# Patient Record
Sex: Female | Born: 1945 | Race: Black or African American | Hispanic: No | Marital: Married | Smoking: Never smoker
Health system: Southern US, Community
[De-identification: ages and names within clinical notes are randomized; demographics above are authoritative.]

## PROBLEM LIST (undated history)

## (undated) DIAGNOSIS — D649 Anemia, unspecified: Secondary | ICD-10-CM

## (undated) DIAGNOSIS — I10 Essential (primary) hypertension: Secondary | ICD-10-CM

## (undated) DIAGNOSIS — E785 Hyperlipidemia, unspecified: Secondary | ICD-10-CM

## (undated) HISTORY — PX: HERNIA REPAIR: SHX51

## (undated) HISTORY — PX: TUBAL LIGATION: SHX77

---

## 2002-09-24 ENCOUNTER — Other Ambulatory Visit: Admission: RE | Admit: 2002-09-24 | Discharge: 2002-09-24 | Payer: Self-pay | Admitting: Internal Medicine

## 2003-11-25 ENCOUNTER — Encounter: Payer: Self-pay | Admitting: Internal Medicine

## 2003-12-29 ENCOUNTER — Other Ambulatory Visit: Admission: RE | Admit: 2003-12-29 | Discharge: 2003-12-29 | Payer: Self-pay | Admitting: Obstetrics and Gynecology

## 2005-01-07 ENCOUNTER — Ambulatory Visit: Payer: Self-pay | Admitting: Internal Medicine

## 2005-04-26 ENCOUNTER — Ambulatory Visit: Payer: Self-pay | Admitting: Family Medicine

## 2005-08-04 ENCOUNTER — Ambulatory Visit: Payer: Self-pay | Admitting: Internal Medicine

## 2006-03-03 ENCOUNTER — Ambulatory Visit: Payer: Self-pay | Admitting: Internal Medicine

## 2006-03-29 ENCOUNTER — Ambulatory Visit: Payer: Self-pay | Admitting: Internal Medicine

## 2006-09-15 ENCOUNTER — Ambulatory Visit: Payer: Self-pay | Admitting: Internal Medicine

## 2007-01-16 ENCOUNTER — Ambulatory Visit: Payer: Self-pay | Admitting: Internal Medicine

## 2007-08-21 ENCOUNTER — Ambulatory Visit: Payer: Self-pay | Admitting: Internal Medicine

## 2007-08-21 DIAGNOSIS — F4320 Adjustment disorder, unspecified: Secondary | ICD-10-CM | POA: Insufficient documentation

## 2007-08-21 DIAGNOSIS — J309 Allergic rhinitis, unspecified: Secondary | ICD-10-CM | POA: Insufficient documentation

## 2007-08-21 DIAGNOSIS — J45909 Unspecified asthma, uncomplicated: Secondary | ICD-10-CM | POA: Insufficient documentation

## 2007-08-21 DIAGNOSIS — F519 Sleep disorder not due to a substance or known physiological condition, unspecified: Secondary | ICD-10-CM | POA: Insufficient documentation

## 2008-01-15 ENCOUNTER — Telehealth: Payer: Self-pay | Admitting: Internal Medicine

## 2008-08-12 ENCOUNTER — Ambulatory Visit: Payer: Self-pay | Admitting: Internal Medicine

## 2008-08-12 DIAGNOSIS — H5789 Other specified disorders of eye and adnexa: Secondary | ICD-10-CM | POA: Insufficient documentation

## 2008-08-12 DIAGNOSIS — J45901 Unspecified asthma with (acute) exacerbation: Secondary | ICD-10-CM | POA: Insufficient documentation

## 2008-08-12 LAB — CONVERTED CEMR LAB: Blood Glucose, Fingerstick: 100

## 2008-08-13 ENCOUNTER — Ambulatory Visit: Payer: Self-pay | Admitting: Internal Medicine

## 2008-08-13 LAB — CONVERTED CEMR LAB
ALT: 16 units/L (ref 0–35)
AST: 18 units/L (ref 0–37)
Albumin: 4.1 g/dL (ref 3.5–5.2)
Alkaline Phosphatase: 85 units/L (ref 39–117)
BUN: 14 mg/dL (ref 6–23)
Bilirubin, Direct: 0.1 mg/dL (ref 0.0–0.3)
CO2: 27 meq/L (ref 19–32)
Calcium: 9.3 mg/dL (ref 8.4–10.5)
Chloride: 108 meq/L (ref 96–112)
Cholesterol: 247 mg/dL (ref 0–200)
Creatinine, Ser: 0.6 mg/dL (ref 0.4–1.2)
Direct LDL: 143.8 mg/dL
Free T4: 0.6 ng/dL (ref 0.6–1.6)
GFR calc Af Amer: 131 mL/min
GFR calc non Af Amer: 108 mL/min
Glucose, Bld: 112 mg/dL — ABNORMAL HIGH (ref 70–99)
HDL: 68.9 mg/dL (ref >39.0)
Potassium: 4.5 meq/L (ref 3.5–5.1)
Sodium: 143 meq/L (ref 135–145)
TSH: 0.32 microintl units/mL — ABNORMAL LOW (ref 0.35–5.50)
Total Bilirubin: 0.6 mg/dL (ref 0.3–1.2)
Total CHOL/HDL Ratio: 3.6
Total Protein: 7 g/dL (ref 6.0–8.3)
Triglycerides: 55 mg/dL (ref 0–149)
VLDL: 11 mg/dL (ref 0–40)

## 2008-08-25 ENCOUNTER — Ambulatory Visit: Payer: Self-pay | Admitting: Internal Medicine

## 2008-08-25 DIAGNOSIS — R7309 Other abnormal glucose: Secondary | ICD-10-CM | POA: Insufficient documentation

## 2008-08-25 DIAGNOSIS — E079 Disorder of thyroid, unspecified: Secondary | ICD-10-CM | POA: Insufficient documentation

## 2009-03-30 ENCOUNTER — Ambulatory Visit: Payer: Self-pay | Admitting: Family Medicine

## 2009-06-12 ENCOUNTER — Ambulatory Visit: Payer: Self-pay | Admitting: Internal Medicine

## 2009-10-29 ENCOUNTER — Ambulatory Visit: Payer: Self-pay | Admitting: Internal Medicine

## 2009-10-29 LAB — CONVERTED CEMR LAB
Thyroglobulin Ab: <30 (ref 0.0–60.0)
Thyroperoxidase Ab SerPl-aCnc: 36.2 (ref 0.0–60.0)

## 2009-11-04 ENCOUNTER — Telehealth: Payer: Self-pay | Admitting: *Deleted

## 2009-11-04 LAB — CONVERTED CEMR LAB
ALT: 16 units/L (ref 0–35)
AST: 19 units/L (ref 0–37)
Albumin: 4 g/dL (ref 3.5–5.2)
Alkaline Phosphatase: 87 units/L (ref 39–117)
BUN: 10 mg/dL (ref 6–23)
Bilirubin, Direct: 0 mg/dL (ref 0.0–0.3)
CO2: 26 meq/L (ref 19–32)
Calcium: 8.9 mg/dL (ref 8.4–10.5)
Chloride: 101 meq/L (ref 96–112)
Creatinine, Ser: 0.9 mg/dL (ref 0.4–1.2)
Free T4: 0.9 ng/dL (ref 0.6–1.6)
GFR calc non Af Amer: 67.34 mL/min (ref >60)
Glucose, Bld: 98 mg/dL (ref 70–99)
Hgb A1c MFr Bld: 6.3 % (ref 4.6–6.5)
Potassium: 3.8 meq/L (ref 3.5–5.1)
Sodium: 137 meq/L (ref 135–145)
T3, Free: 3 pg/mL (ref 2.3–4.2)
TSH: 0.82 microintl units/mL (ref 0.35–5.50)
Total Bilirubin: 0.6 mg/dL (ref 0.3–1.2)
Total Protein: 6.8 g/dL (ref 6.0–8.3)

## 2009-11-13 ENCOUNTER — Encounter: Payer: Self-pay | Admitting: Internal Medicine

## 2010-03-26 ENCOUNTER — Telehealth: Payer: Self-pay | Admitting: *Deleted

## 2010-11-16 NOTE — Assessment & Plan Note (Signed)
Summary: ear ache/mhf   Vital Signs:  Patient profile:   65 year old female Weight:      220 pounds Temp:     98.5 degrees F oral BP sitting:   140 / 80  (left arm) Cuff size:   regular  Vitals Entered By: Sid Falcon LPN (March 30, 2009 10:59 AM) CC: Right ear pain X 1 week   History of Present Illness: Patient seen as a work in with right earache of one week duration.  she used a perm on her hair approximately one week ago and was uncertain whether or some of that may have gotten into her ear aggravated things. She has not had any drainage. No fever. No recent swimming.  no recent hearing changes. Denies nasal congestive symptoms.  Allergies: 1)  ! Sulfa 2)  ! Penicillin  Past History:  Past Medical History: Last updated: 08/25/2008 Allergic rhinitis Asthma     Review of Systems       see history of present illness. Also patient denies any vertigo or dizziness.  Physical Exam  Ears:  patient has mild erythema the left canal the eardrum appears normal. Right eardrum is not visualized secondary to some cerumen deep in the canal. She has positive erythema involving the canal and mild swelling of the external canal. Mouth:  Oral mucosa and oropharynx without lesions or exudates.  Teeth in good repair. Neck:  No deformities, masses, or tenderness noted. Lungs:  Normal respiratory effort, chest expands symmetrically. Lungs are clear to auscultation, no crackles or wheezes.   Impression & Recommendations:  Problem # 1:  OTITIS EXTERNA (ICD-380.10) Assessment New  we'll prescribe medicated antibiotic drops. Keep ear dry as possible. We offered manual removal of debris but she was reluctant because of sensitivity. We explained this may ultimately have to be removed for full healing.  Her updated medication list for this problem includes:    Cortisporin 3.5-10000-1 Soln (Neomycin-polymyxin-hc) .Marland Kitchen... 3-4 drops each ear qid for 5-7 days  Complete Medication List: 1)   Hydrochlorothiazide 25 Mg Tabs (Hydrochlorothiazide) .Marland Kitchen.. 1 by mouth once daily as needed 2)  Advair Diskus 100-50 Mcg/dose Misc (Fluticasone-salmeterol) .... As needed 3)  Cortisporin 3.5-10000-1 Soln (Neomycin-polymyxin-hc) .... 3-4 drops each ear qid for 5-7 days  Patient Instructions: 1)  Keep ears dry as possible. Be in touch in a week or so if symptoms not improved. Prescriptions: CORTISPORIN 3.5-10000-1 SOLN (NEOMYCIN-POLYMYXIN-HC) 3-4 drops each ear qid for 5-7 days  #10 ml x 1   Entered and Authorized by:   Evelena Peat MD   Signed by:   Evelena Peat MD on 03/30/2009   Method used:   [facility] to        [phone] (retail)       [address] [place], [place]  [address]       Ph: [phone] or [phone]       Fax: [phone]   RxID:   [id number]

## 2010-11-16 NOTE — Progress Notes (Signed)
Summary: new phone # please call  Phone Note Call from Patient Call back at [phone]   Caller: Patient Call For: Madelin Headings MD Summary of Call: Marlissa Emerick please call pt at new phone # [phone] Initial call taken by: Heron Sabins,  November 04, 2009 12:38 PM  Follow-up for Phone Call        Pt aware of results. Follow-up by: [name], CMA (AAMA),  November 04, 2009 2:53 PM

## 2010-11-16 NOTE — Consult Note (Signed)
Summary: [facility]  [facility]   Imported By: Maryln Gottron 12/04/2009 15:36:05  _____________________________________________________________________  External Attachment:    Type:   Image     Comment:   External Document

## 2010-11-16 NOTE — Assessment & Plan Note (Signed)
Summary: not sleeping/mhf  Medications Added HYDROCHLOROTHIAZIDE 25 MG TABS (HYDROCHLOROTHIAZIDE) 1 by mouth once daily as needed * LUNESTA 2 MG  TABS (ESZOPICLONE) AND 3 MG 1 by mouth hs as directed samples      Allergies Added: ! SULFA ! PENICILLIN  Vital Signs:  Patient Profile:   65 Years Old Female Weight:      203 pounds Pulse rate:   88 / minute BP sitting:   140 / 80  (left arm) Cuff size:   regular  Vitals Entered By: [name], CMA (August 21, 2007 2:15 PM)                 Chief Complaint:  Pt having panic attacks, hasn't sleep x 2 days, takes benadryl and only sleeps 2 hours, and seen a homepathic md and they gave her trazadone 50mg  and natural progesterone but pt hasn't taken them yet.  History of Present Illness: Jill Burton comes in for above reason for an acute visit.   homeopathic doc rec trazadone 50 x 1 and didn't like tte way she feels.    Husband has cancer  colon and in an out of hospital on chemo.  Last good sleep or baseline about 2 months ago.   No caffiene or etoh. thinkds the sleep issue is stress. No previous hx of panic disorder of sig anxiety. ASthma wheezing thinks ok.  5-6 months ago was on thyroid meds per Dr. Alessandra Burton but  not on this now. She has lost weight for ? reason  Doesn't usually like to take meds had normal sugar per Dr Jill Burton who wanted to do more blood tests.   Current Allergies (reviewed today): ! SULFA ! PENICILLIN  Past Medical History:    Reviewed history and no changes required:       Allergic rhinitis       Asthma  Past Surgical History:    Caesarean section   Family History:    [name]  Social History:    Married    Never Smoked   Risk Factors:  Tobacco use:  never   Review of Systems       see hpi   Physical Exam  General:     alert, well-developed, well-nourished, and well-hydrated.   Head:     normocephalic.   Eyes:     vision grossly intact, pupils equal, and pupils round.     Neck:     No deformities, masses, or tenderness noted. Lungs:     Normal respiratory effort, chest expands symmetrically. Lungs are clear to auscultation, no crackles or wheezes. Heart:     Normal rate and regular rhythm. S1 and S2 normal without gallop, murmur, click, rub or other extra sounds. Abdomen:     Bowel sounds positive,abdomen soft and non-tender without masses, organomegaly or hernias noted. Neurologic:     grossly normal    no tremor Skin:     turgor normal and color normal.   Cervical Nodes:     No lymphadenopathy noted    Impression & Recommendations:  Problem # 1:  INSOMNIA-SLEEP DISORDER-UNSPEC (ICD-307.40) seems to be reactive worsening of a baseline problem   disc options but would try lunesta smples 2 mg and then 3 mg and call about progress.  ? about hx of thyroid rx  have no records regarding this to give advice.  Would rec tsh ft3 and free t4  with thyroid antibody studies in the future when can. 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 1 by mouth once daily as needed 2)  Lunesta 2 Mg Tabs (eszopiclone) and 3 Mg  .Marland KitchenMarland Kitchen. 1 by mouth hs as directed samples   Patient Instructions: 1)  take one 2 mg tab at night for 2-3 nights  2)  If helpful then take as needed 3)  If not helpful then try one 3 mg tab at night for 2-3 nights.    ]

## 2010-11-16 NOTE — Assessment & Plan Note (Signed)
Summary: FU ON LABS/NJR   Vital Signs:  Patient Profile:   65 Years Old Female Weight:      229 pounds Pulse rate:   78 / minute BP sitting:   120 / 80  (left arm) Cuff size:   large  Vitals Entered By: [name], CMA (August 25, 2008 2:33 PM)                 Chief Complaint:  Follow up.  History of Present Illness: Jill Burton is here for a follow up on labs.  and Asthma. Current Problems:  REDNESS OR DISCHARGE OF EYE (ICD-379.93) better after patano; drops  ASTHMA, WITH ACUTE EXACERBATION (ICD-493.92) better after prednisone . Usually takes Advair INSOMNIA-SLEEP DISORDER-UNSPEC (ICD-307.40) ALLERGIC RHINITIS (ICD-477.9) Asks about weight and how to lose weight.      Prior Medications Reviewed Using: Patient Recall  Updated Prior Medication List: HYDROCHLOROTHIAZIDE 25 MG TABS (HYDROCHLOROTHIAZIDE) 1 by mouth once daily as needed PATADAY 0.2 % SOLN (OLOPATADINE HCL) 1 qtt  each eye once daily  Current Allergies (reviewed today): ! SULFA ! 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         always very busy and active   hard to relax    Physical Exam  General:     Well-developed,well-nourished,in no acute distress; alert,appropriate and cooperative throughout examination Head:     normocephalic and atraumatic.   Eyes:     no more redness or discharge vision grossly intact, pupils equal, and pupils round.   Ears:     R ear normal and L ear normal.   Neck:     No deformities, masses, or tenderness noted. Lungs:     Normal respiratory effort, chest expands symmetrically. Lungs are clear to auscultation, no crackles or wheezes. Heart:     Normal rate and regular rhythm. S1 and S2 normal without  gallop, murmur, click, rub or other extra sounds. Abdomen:     Bowel sounds positive,abdomen soft and non-tender without masses, organomegaly or hernias noted. Pulses:     intact  Extremities:     no cce  Neurologic:     non focal Cervical Nodes:     No lymphadenopathy noted Psych:     Oriented X3, normally interactive, and good eye contact.   Additional Exam:     see labs    Impression & Recommendations:  Problem # 1:  ASTHMA, WITH ACUTE EXACERBATION (ICD-493.92) Assessment: Improved  The following medications were removed from the medication list:    Prednisone 20 Mg Tabs (Prednisone) .Marland Kitchen... 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 1 by mouth  once daily as needed 2)  Pataday 0.2 % Soln (Olopatadine hcl) .Marland Kitchen.. 1 qtt  each eye once daily   Patient Instructions: 1)  take  advair 1 puff two times a day for a month and if breathing ok then can decrease to 1 per day.  2)  Avoid simple sugars and sweets . white bread   and white rice  to help blood sugar.     3)  3200 calories is a pound. 4)  No calories in their beverages  . dont skip meals  avoid eating out.   Last meal of day should be the lightest. 5)  Eye drops if needed 6)  Recommend repeating   thyroid tests and if still abnormal rec   endocrinology referral to evaluate.   7)  tsh free t3 free t4   hg a1c   790.21   ]

## 2010-11-16 NOTE — Assessment & Plan Note (Signed)
Summary: eyes hurt/mhf   Vital Signs:  Patient Profile:   65 Years Old Female Weight:      228 pounds O2 Sat:      99 % O2 treatment:    Room Air Temp:     97.9 degrees F oral Pulse rate:   60 / minute BP sitting:   120 / 80  (left arm) Cuff size:   large  Vitals Entered By: [name], CMA (August 12, 2008 11:04 AM)             CBG Result 100     Chief Complaint:  Multiple medical problems or concerns.  History of Present Illness: Jill Burton is here for acute evaluation because her eyes have  been blood shot off and on x 1 week.She has used some otc Get the red out drops and contiuing .sx   No change in vision and no itching or discharge otherwise.    Pt is also having some vaginal itching but no discharge and wants to discuss doing tsh because she is not loosing wt. Pt thinks that the vaginal itching is due to diabetes.  Has been wheezing about a week    restarted and ? needs rx for   After asthma medicaition  ?  sees dr Alessandra Bevels  for homeopathic injections mg 2 x per day in the past.     Her last appt with [place] was almost a year ago 11 08.   Apparently care via Dr Alessandra Bevels and not here?       Prior Medications Reviewed Using: Patient Recall  Prior Medication List:  HYDROCHLOROTHIAZIDE 25 MG TABS (HYDROCHLOROTHIAZIDE) 1 by mouth once daily as needed * LUNESTA 2 MG  TABS (ESZOPICLONE) AND 3 MG 1 by mouth hs as directed samples FLEXERIL 10 MG  TABS (CYCLOBENZAPRINE HCL) one by mouth tid   Current Allergies (reviewed today): ! SULFA ! PENICILLIN  Past Medical History:    Allergic rhinitis    Asthma       Past Surgical History:    Caesarean section    Social History:    Married      Never Smoked    Review of Systems  The patient denies anorexia, fever, weight loss, chest pain, syncope, hemoptysis, abdominal pain, melena, hematochezia, severe indigestion/heartburn, hematuria, transient blindness, difficulty walking, unusual weight change, enlarged  lymph nodes, and angioedema.         no photophobia nvd    Physical Exam  General:     alert, well-developed, well-nourished, and well-hydrated.  wheezing in nad  mildly hoarse Head:     normocephalic and atraumatic.   Eyes:     vision grossly intact, pupils equal, and pupils round.  mild redness without flush or tearing or dc Ears:     R ear normal, L ear normal, and no external deformities.   Nose:     no external deformity and no external erythema.  stuffy Mouth:     pharynx pink and moist and no erythema.   Neck:     No deformities, masses, or tenderness noted.no thyromegaly.   Lungs:     no intercostal retractions and no accessory muscle use.  inc resp rate  diffuse wheezing  bilateral without rales or rhonchi  soomewhat tight  Heart:     normal rate, regular rhythm, no murmur, no gallop, and no lifts.   Abdomen:     Bowel sounds positive,abdomen soft and non-tender without masses, organomegaly or hernias noted. Pulses:  no cce  Extremities:     no cce  Skin:     turgor normal, color normal, no ecchymoses, and no petechiae.   Cervical Nodes:     No lymphadenopathy noted Psych:     Oriented X3, normally interactive, and good eye contact.      Impression & Recommendations:  Problem # 1:  REDNESS OR DISCHARGE OF EYE (ICD-379.93) poss rebound from otc drop?   no vision change or obvious infection.     samples of pataday given . I am much more concerns about her asthma than her eye findings at present  Problem # 2:  ASTHMA, WITH ACUTE EXACERBATION (ZOX-096.04) Assessment: New Pt has typically not come in for  follow up or chornic care of asthma and  is apparently under care for various things for homeopathic rx of asthm with  dr Alessandra Bevels therefore management is problematic and it seems that the majority of ovs are for acute problems and has a hx of asthma flares... Her updated medication list for this problem includes:    Prednisone 20 Mg Tabs (Prednisone) .Marland Kitchen... Take  3 by mouth once daily for 2 days then 2 by mouth once daily for 3 days or as directed    TOday has   multiple concerns about dm weight and vaginal signs   will need to be addressed at a regular ov or cpx    as today is eval and rx for the acute problems   counseled about improtance of asthma control  and follow up   Complete Medication List: 1)  Hydrochlorothiazide 25 Mg Tabs (Hydrochlorothiazide) .Marland Kitchen.. 1 by mouth once daily as needed 2)  Lunesta 2 Mg Tabs (eszopiclone) and 3 Mg  .Marland KitchenMarland Kitchen. 1 by mouth hs as directed samples 3)  Flexeril 10 Mg Tabs (Cyclobenzaprine hcl) .... One by mouth tid 4)  Prednisone 20 Mg Tabs (Prednisone) .... Take 3 by mouth once daily for 2 days then 2 by mouth once daily for 3 days or as directed 5)  Pataday 0.2 % Soln (Olopatadine hcl) .Marland Kitchen.. 1 qtt  each eye once daily   Patient Instructions: 1)  take prednisone  and eye drops   2)  schedule for fasting labs  lipid bmp tsh free t4 lfts  dx weight gain fatigue  asthma the return office visit with results and reassessment.  3)  See if  you can get copy of labs of Dr Alessandra Bevels.     Prescriptions: PREDNISONE 20 MG TABS (PREDNISONE) take 3 by mouth once daily for 2 days then 2 by mouth once daily for 3 days or as directed  #20 x 0   Entered and Authorized by:   Madelin Headings MD   Signed by:   Madelin Headings MD on 08/12/2008   Method used:   [facility]ronically to        [facility]  [phone]* (retail)       [address] [place], [place]  [address]       Ph: [phone] or [phone]       Fax: [phone]   RxID:   [id number]  ]

## 2010-11-16 NOTE — Assessment & Plan Note (Signed)
Summary: follow up/pt req to get thyroid retested/cjr/pt rescd--office...   Vital Signs:  Patient profile:   65 year old female Menstrual status:  postmenopausal Weight:      230 pounds O2 Sat:      98 % on Room air Temp:     98.2 degrees F oral Pulse rate:   72 / minute BP sitting:   110 / 80  (right arm) Cuff size:   large  Vitals Entered By: [name], CMA (AAMA) (October 29, 2009 10:04 AM)  O2 Flow:  Room air CC: Pt is here for coughing and congestion. Pt states that she is also having blood sinus congestion and wheezing in chest area. Pt has been using advair and proair with sudafed x 3 weeks. Pt would also like to have her thyroid check as well.   History of Present Illness: Joeann Wehrman comein today  has had a cold for a week and / if asthma flaring took sudafed.  and albuterol and advair for weeks.  Using advair  once a day   .   No fever .    flares with cold  wheezing for 2 weeks  .   med helps  for 6 hours. sometimes gets mouth sores with advair.Husband concerned about her wheezing. Never tested  allergy but ? all to mold .     Supplements    had been on herbs in the past  . ? if any one week ago. Not  hyroid meds.last year rec to repeat thyroid and was done elsewhere but we didnt get  copies(? Dr Alessandra Bevels).  Tends to gain weight easily so wants to recheck.     Preventive Screening-Counseling & Management  Alcohol-Tobacco     Alcohol drinks/day: 0     Smoking Status: never  Caffeine-Diet-Exercise     Caffeine use/day: 1     Does Patient Exercise: no  Current Medications (verified): 1)  Hydrochlorothiazide 25 Mg Tabs (Hydrochlorothiazide) .Marland Kitchen.. 1 By Mouth Once Daily As Needed 2)  Advair Diskus 100-50 Mcg/dose Misc (Fluticasone-Salmeterol) .... As Needed 3)  Proair Hfa 108 (90 Base) Mcg/act Aers (Albuterol Sulfate) .... Use As Directed 4)  Sudafed 30 Mg Tabs (Pseudoephedrine Hcl)  Allergies (verified): 1)  ! Sulfa 2)  ! Penicillin  Past History:  Past  medical, surgical, family and social histories (including risk factors) reviewed for relevance to current acute and chronic problems.  Past Medical History: Reviewed history from 08/25/2008 and no changes required. Allergic rhinitis Asthma     Past Surgical History: Reviewed history from 08/12/2008 and no changes required. Caesarean section   Family History: Reviewed history from 08/21/2007 and no changes required. [place] [facility]  Social History: Reviewed history from 08/25/2008 and no changes required. 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  The patient denies anorexia, fever, weight loss, weight gain, vision loss, chest pain, syncope, peripheral edema, headaches, abdominal pain, melena, hematochezia, severe indigestion/heartburn, difficulty walking, unusual weight change, abnormal bleeding, enlarged lymph nodes, and angioedema.    Physical Exam  General:  Well-developed,well-nourished,in no acute distress; alert,appropriate and cooperative throughout examination  som wheezing   at rest  Head:  normocephalic and atraumatic.   Eyes:  vision grossly intact and pupils equal.   Ears:  R ear normal, L ear normal, and no external deformities.   Nose:  no external deformity, no external erythema,  and no nasal discharge.   Mouth:  pharynx pink and moist.   Neck:  No deformities, masses, or tenderness noted. Lungs:  inc work of breathing  no flaring    ...diffuse wheezing no rales or rhonchi   tight   ...decrease after nebulizer ventolin   better airflow and few wheezes  Heart:  normal rate, regular rhythm, no murmur, no gallop, and no rub.   Pulses:  nl cap   refill  Extremities:  no clubbing cyanosis or edema  Neurologic:  alert & oriented X3, strength normal in all extremities, and gait normal.   Skin:  turgor normal, color normal, no ecchymoses, and no petechiae.   Cervical Nodes:  No lymphadenopathy  noted Psych:  Oriented X3, good eye contact, and not depressed appearing.     Impression & Recommendations:  Problem # 1:  ASTHMA, WITH ACUTE EXACERBATION (ICD-493.92)  concerning flares on advair although only on once a day.      poss allergy and viral triggers.   counseled  and rec  allergy asthma consult .   Patient agrees  .   wash mouth out after advair.   may do better on hfa instead but will have specialist determine this . in the meantime Her updated medication list for this problem includes:    Advair Diskus 100-50 Mcg/dose Misc (Fluticasone-salmeterol) .Marland Kitchen... As needed    Proair Hfa 108 (90 Base) Mcg/act Aers (Albuterol sulfate) ..... Use as directed    Prednisone 20 Mg Tabs (Prednisone) .Marland Kitchen... Take 3 by mouth once daily for 2 days  and then 2 by mouth once daily for 3 days  or as directed  Orders: Allergy Referral  (Allergy) Albuterol Sulfate Sol 1mg  unit dose (H0623) Nebulizer Tx (76283) Prescription Created Electronically [phone])  Problem # 2:  THYROID STIMULATING HORMONE, ABNORMAL (ICD-246.9)  needs repeat   i beleive she is off all supplements of import .      Orders: TLB-TSH (Thyroid Stimulating Hormone) (84443-TSH) TLB-BMP (Basic Metabolic Panel-BMET) (80048-METABOL) TLB-T3, Free (Triiodothyronine) (84481-T3FREE) TLB-T4 (Thyrox), Free [phone]) T- * Misc. Laboratory test [phone])  Problem # 3:  HYPERGLYCEMIA (ICD-790.29)  repeat labs      Orders: TLB-TSH (Thyroid Stimulating Hormone) (84443-TSH) TLB-BMP (Basic Metabolic Panel-BMET) (80048-METABOL) TLB-T3, Free (Triiodothyronine) (84481-T3FREE) TLB-T4 (Thyrox), Free [phone]) T- * Misc. Laboratory test [phone]) TLB-A1C / Hgb A1C (Glycohemoglobin) (83036-A1C) TLB-Hepatic/Liver Function Pnl (80076-HEPATIC)  Labs Reviewed: Creat: 0.6 (08/13/2008)     Complete Medication List: 1)  Hydrochlorothiazide 25 Mg Tabs (Hydrochlorothiazide) .Marland Kitchen.. 1 by mouth once daily as needed 2)  Advair Diskus 100-50 Mcg/dose Misc  (Fluticasone-salmeterol) .... As needed 3)  Proair Hfa 108 (90 Base) Mcg/act Aers (Albuterol sulfate) .... Use as directed 4)  Sudafed 30 Mg Tabs (Pseudoephedrine hcl) 5)  Prednisone 20 Mg Tabs (Prednisone) .... Take 3 by mouth once daily for 2 days  and then 2 by mouth once daily for 3 days  or as directed  Patient Instructions: 1)  take prednisone course. 2)  increase advair to two times a day  for now. 3)  will do allergy referral 4)  You will be informed of lab results when available. 5)   if thyroid abnormal will do  endocrinology consult. Prescriptions: PREDNISONE 20 MG TABS (PREDNISONE) take 3 by mouth once daily for 2 days  and then 2 by mouth once daily for 3 days  or as directed  #20 x 0   Entered and Authorized by:   Madelin Headings MD  Signed by:   Madelin Headings MD on 10/29/2009   Method used:   Electronically to        [facility]  [phone]* (retail)       [address] [place], [place]  [address]       Ph: [phone] or [phone]       Fax: [phone]   RxID:   [id number]    Medication Administration  Medication # 1:    Medication: Albuterol Sulfate Sol 1mg  unit dose    Diagnosis: ASTHMA, WITH ACUTE EXACERBATION [phone])    Dose: 3ml    Route: inhaled    Exp Date: 03/18/2011    Lot #: [id number]    Mfr: nephron    Patient tolerated medication without complications    Given by: [name], CMA (AAMA) (October 29, 2009 11:35 AM)  Orders Added: 1)  TLB-TSH (Thyroid Stimulating Hormone) [84443-TSH] 2)  TLB-BMP (Basic Metabolic Panel-BMET) [80048-METABOL] 3)  TLB-T3, Free (Triiodothyronine) [23557-D2KGUR] 4)  TLB-T4 (Thyrox), Free [42706-CB7S] 5)  T- * Misc. Laboratory test [99999] 6)  TLB-A1C / Hgb A1C (Glycohemoglobin) [83036-A1C] 7)  TLB-Hepatic/Liver Function Pnl [80076-HEPATIC] 8)  Allergy Referral  [Allergy] 9)  Albuterol Sulfate Sol 1mg  unit dose [J7613] 10)  Nebulizer Tx [94640] 11)  Est. Patient Level IV [28315] 12)   Prescription Created Electronically [phone]

## 2010-11-16 NOTE — Progress Notes (Signed)
Summary: refill on predisone  Phone Note Call from Patient Call back at [facility] Phone [phone]   Caller: Patient Summary of Call: Pt states that her asthma is doing fine but would like a refill on prednisone to have on hand incase her asthma flares up again. "Just for safety reasons" She is going on a cruise and would like to have some on hand. Can we call this in? Initial call taken by: [name], CMA Duncan Dull),  March 26, 2010 10:48 AM  Follow-up for Phone Call        need to call  your asthma specialist   about this  .    Follow-up by: Madelin Headings MD,  March 26, 2010 1:30 PM  Additional Follow-up for Phone Call Additional follow up Details #1::        Pt aware of this. Additional Follow-up by: [name], CMA (AAMA),  March 26, 2010 1:45 PM

## 2011-03-17 ENCOUNTER — Other Ambulatory Visit: Payer: Self-pay | Admitting: Internal Medicine

## 2011-03-17 NOTE — Telephone Encounter (Signed)
Pt needs to schedule a cpx before next refill. Letter sent to pt.

## 2011-04-04 ENCOUNTER — Ambulatory Visit (INDEPENDENT_AMBULATORY_CARE_PROVIDER_SITE_OTHER): Payer: PRIVATE HEALTH INSURANCE | Admitting: Internal Medicine

## 2011-04-04 ENCOUNTER — Encounter: Payer: Self-pay | Admitting: Internal Medicine

## 2011-04-04 VITALS — BP 120/80 | HR 61 | Temp 98.6°F | Wt 218.0 lb

## 2011-04-04 DIAGNOSIS — R5381 Other malaise: Secondary | ICD-10-CM

## 2011-04-04 DIAGNOSIS — R5383 Other fatigue: Secondary | ICD-10-CM

## 2011-04-04 DIAGNOSIS — F519 Sleep disorder not due to a substance or known physiological condition, unspecified: Secondary | ICD-10-CM

## 2011-04-04 DIAGNOSIS — J45901 Unspecified asthma with (acute) exacerbation: Secondary | ICD-10-CM

## 2011-04-04 MED ORDER — FLUTICASONE-SALMETEROL 100-50 MCG/DOSE IN AEPB: 1.0000 | INHALATION_SPRAY | Freq: Two times a day (BID) | RESPIRATORY_TRACT | Status: DC

## 2011-04-04 MED ORDER — ALBUTEROL SULFATE HFA 108 (90 BASE) MCG/ACT IN AERS: 2.0000 | INHALATION_SPRAY | Freq: Four times a day (QID) | RESPIRATORY_TRACT | Status: DC | PRN

## 2011-04-04 MED ORDER — PREDNISONE 20 MG PO TABS: 20.0000 mg | ORAL_TABLET | Freq: Every day | ORAL | Status: AC

## 2011-04-04 NOTE — Patient Instructions (Signed)
Your asthma is active  Sometimes the advair powder adds to the problem  But ok to use if you think doesn't do this.  Take the prednisone for the asthma flare.  Take the singulair   For  At least 2 weeks  And if helping continue on this.  This is a Occupational psychologist med and will not work if taking here and there.  Make a lab appt and we can do lab tests  And follow up viist.

## 2011-04-04 NOTE — Progress Notes (Signed)
Subjective:    Patient ID: Jill Burton, female    DOB: 14-Nov-1945, 65 y.o.   MRN: [id number]  HPI Patient comes in today as an acute work and for the above problem.  Her last visit here was January 2011 for exam acute asthma. She has been seen by the allergist specialist last time over a year ago.  She has not followed up for routine continual care.   Tight  Recently and now coughing up spray.   Last  Good breathing a couple of months No fever. Hard to cough up.  Acts up at night.   Using environmental    Changes.  Also has consulted with Dr Alessandra Bevels alternative medicine and give her MAG for her asthma at times and did "WHOLE lot of blood tests" and advised her about her health Says she allergic to mild  Per her and sx when she takes milk but ? Allergy testing no revealing. She is tired and concern about anemia  Takes   hctz for ? Fluid     Review of Systems ROS:  GEN/ HEENTNo fever, significant weight changes sweats headaches vision problems hearing changes, CV/ PULM; No chest pain syncope,edema  See  change in exercise tolerance. GI /GU: No adominal pain, vomiting, change in bowel habits. No blood in the stool. No significant GU symptoms. SKIN/HEME: ,no acute skin rashes suspicious lesions or bleeding. No lymphadenopathy, nodules, masses.  NEURO/ PSYCH:  No neurologic signs such as weakness numbness No depression anxiety. IMM/ Allergy: No unusual infections.  See hpin about allergy .   REST of 12 system review negative  Past history family history social history reviewed in the electronic medical record. Last visit over a year ago and that was for acute illness asthma again.       Objective:   Physical Exam Physical Exam: Vital signs reviewed MVH:QION is a well-developed well-nourished alert cooperative  aafemale who appears her stated age in no acute distress.  But seems to be wheezing .Marland Kitchen HEENT: normocephalic  traumatic , Eyes: PERRL EOM's full, conjunctiva clear, Nares:  paten,t no deformity discharge or tenderness., Ears: no deformity EAC's clear TMs with normal landmarks. Mouth: clear OP, no lesions, edema.  Moist mucous membranes. Dentition in adequate repair. NECK: supple without masses, thyromegaly or bruits. CHEST/PULM:  Diffuse  wheeze , but no  rales or rhonchi. No chest wall deformities or tenderness. CV: PMI is nondisplaced, S1 S2 no gallops, murmurs, rubs. Peripheral pulses are full without delay.No JVD .  ABDOMEN: Bowel sounds normal nontender  No guard or rebound, no hepato splenomegal no CVA tenderness.  . Extremtities:  No clubbing cyanosis or edema, no acute joint swelling or redness no focal atrophy NEURO:  Oriented x3, cranial nerves 3-12 appear to be intact, no obvious focal weakness,gait within normal limits no abnormal reflexes or asymmetrical SKIN: No acute rashes normal turgor, color, no bruising or petechiae. PSYCH: Oriented, good eye contact, no obvious depression anxiety, cognition and judgment appear normal.       Assessment & Plan:  Pattern of lack of followup for her medical problems. Acute visit asking for chronic medical care. Seems to prefer alternative med care but not sticking to one treatment plan and thus non adherent to plan.   Decides for self on an individual basis.  Asthma seems to not be controlled and unclear how she sees importance of control. She did not see allergy doc as that helpful  But prefers Dr Roylene Reason approach.   But not  follow up in a consistent manner . Tends to show up for visits on an as needed basis as she see appropriate.  Either way rec acute asthma rx today and then plan labs and follow up .  I reviewed standard asthma care   Again .  Have no records from Dr Alessandra Bevels despite labs being done .  Will plan labs here and then follow up.  Taking hctz for fluid ?  Prn not ht.  ? No OSA  Has fatigue  And is menopausal Prolonged visit today and counseling  Total visit > 50% spent counseling and  coordinating care

## 2011-04-17 ENCOUNTER — Encounter: Payer: Self-pay | Admitting: Internal Medicine

## 2011-04-17 DIAGNOSIS — R5383 Other fatigue: Secondary | ICD-10-CM | POA: Insufficient documentation

## 2011-04-28 ENCOUNTER — Other Ambulatory Visit: Payer: PRIVATE HEALTH INSURANCE

## 2011-05-09 ENCOUNTER — Ambulatory Visit: Payer: PRIVATE HEALTH INSURANCE | Admitting: Internal Medicine

## 2011-05-23 ENCOUNTER — Telehealth: Payer: Self-pay | Admitting: Internal Medicine

## 2011-05-23 NOTE — Telephone Encounter (Signed)
Pt called 8/6. Is having her cpx labs on Wed, 8/8. Also sees a Corporate treasurer at [employer] [facility]. They have labs they are requesting, and she only wants to have labs done one time. Pt requesting we call this place and ask for Thayer Ohm (ext. [phone]) to find out what labs they want her to have and add them to Wed. I have no idea if this can be done, but if not, call pt please. Ph # at Alessandra Bevels is [phone]

## 2011-05-23 NOTE — Telephone Encounter (Addendum)
Since Dr. Fabian Sharp is out this week. It would be better if she just gets the labs that Dr. Fabian Sharp wants done first. Then schedule a follow up to go over the labs. Pt wants to know if we can give her a written rx to have take to Dr. Martyn Ehrich office with the labs that Dr. Fabian Sharp wants done. This way she gets only 1 set of labs. Pt needs to have cpx labs done dx v70.0 per lab appt. Please call pt back to let her know if you can fax this over to Dr. Martyn Ehrich office.

## 2011-05-25 ENCOUNTER — Other Ambulatory Visit (INDEPENDENT_AMBULATORY_CARE_PROVIDER_SITE_OTHER): Payer: PRIVATE HEALTH INSURANCE

## 2011-05-25 DIAGNOSIS — Z0000 Encounter for general adult medical examination without abnormal findings: Secondary | ICD-10-CM

## 2011-05-25 LAB — CBC WITH DIFFERENTIAL/PLATELET
Basophils Absolute: 0 10*3/uL (ref 0.0–0.1)
Basophils Relative: 0.3 % (ref 0.0–3.0)
Eosinophils Absolute: 0.4 10*3/uL (ref 0.0–0.7)
Eosinophils Relative: 3.7 % (ref 0.0–5.0)
HCT: 44 % (ref 36.0–46.0)
Hemoglobin: 14.2 g/dL (ref 12.0–15.0)
Lymphocytes Relative: 27.5 % (ref 12.0–46.0)
Lymphs Abs: 2.8 10*3/uL (ref 0.7–4.0)
MCHC: 32.3 g/dL (ref 30.0–36.0)
MCV: 89.3 fl (ref 78.0–100.0)
Monocytes Absolute: 0.6 10*3/uL (ref 0.1–1.0)
Monocytes Relative: 5.9 % (ref 3.0–12.0)
Neutro Abs: 6.3 10*3/uL (ref 1.4–7.7)
Neutrophils Relative %: 62.6 % (ref 43.0–77.0)
Platelets: 232 10*3/uL (ref 150.0–400.0)
RBC: 4.93 Mil/uL (ref 3.87–5.11)
RDW: 14.5 % (ref 11.5–14.6)
WBC: 10.1 10*3/uL (ref 4.5–10.5)

## 2011-05-25 LAB — BASIC METABOLIC PANEL
BUN: 12 mg/dL (ref 6–23)
CO2: 32 mEq/L (ref 19–32)
Calcium: 9.5 mg/dL (ref 8.4–10.5)
Chloride: 106 mEq/L (ref 96–112)
Creatinine, Ser: 0.7 mg/dL (ref 0.4–1.2)
GFR: 88.09 mL/min (ref >60.00)
Glucose, Bld: 103 mg/dL — ABNORMAL HIGH (ref 70–99)
Potassium: 5.5 mEq/L — ABNORMAL HIGH (ref 3.5–5.1)
Sodium: 144 mEq/L (ref 135–145)

## 2011-05-25 LAB — POCT URINALYSIS DIPSTICK
Bilirubin, UA: NEGATIVE
Blood, UA: NEGATIVE
Glucose, UA: NEGATIVE
Ketones, UA: NEGATIVE
Leukocytes, UA: NEGATIVE
Nitrite, UA: NEGATIVE
Protein, UA: NEGATIVE
Spec Grav, UA: 1.015
Urobilinogen, UA: 0.2
pH, UA: 8.5

## 2011-05-25 LAB — LIPID PANEL
Cholesterol: 215 mg/dL — ABNORMAL HIGH (ref 0–200)
HDL: 60.2 mg/dL (ref >39.00)
Total CHOL/HDL Ratio: 4
Triglycerides: 159 mg/dL — ABNORMAL HIGH (ref 0.0–149.0)
VLDL: 31.8 mg/dL (ref 0.0–40.0)

## 2011-05-25 LAB — HEPATIC FUNCTION PANEL
ALT: 20 U/L (ref 0–35)
AST: 18 U/L (ref 0–37)
Albumin: 4.1 g/dL (ref 3.5–5.2)
Alkaline Phosphatase: 83 U/L (ref 39–117)
Bilirubin, Direct: 0.1 mg/dL (ref 0.0–0.3)
Total Bilirubin: 0.8 mg/dL (ref 0.3–1.2)
Total Protein: 6.9 g/dL (ref 6.0–8.3)

## 2011-05-25 LAB — LDL CHOLESTEROL, DIRECT: Direct LDL: 131.1 mg/dL

## 2011-05-25 NOTE — Telephone Encounter (Signed)
Script ready and pt aware. 

## 2011-05-25 NOTE — Patient Instructions (Signed)
Patient stated that TSH has already been done at another Doctor's office and she will bring results to Dr.Panosh on her physicals visit.

## 2011-05-25 NOTE — Telephone Encounter (Signed)
These were ordered. Please fax the order to Dr. Martyn Ehrich office

## 2011-05-25 NOTE — Telephone Encounter (Signed)
Script faxed.

## 2011-06-03 ENCOUNTER — Encounter: Payer: Self-pay | Admitting: *Deleted

## 2011-07-07 ENCOUNTER — Institutional Professional Consult (permissible substitution): Payer: PRIVATE HEALTH INSURANCE | Admitting: Internal Medicine

## 2011-07-26 ENCOUNTER — Telehealth: Payer: Self-pay | Admitting: Internal Medicine

## 2011-07-26 DIAGNOSIS — J45909 Unspecified asthma, uncomplicated: Secondary | ICD-10-CM

## 2011-07-26 NOTE — Telephone Encounter (Signed)
Per Dr. Fabian Sharp- ok to do.

## 2011-07-26 NOTE — Telephone Encounter (Signed)
Pt would like a referral to see Dr Willa [phone]) for asthma.

## 2011-07-27 NOTE — Telephone Encounter (Signed)
Order sent to [facility] 

## 2011-10-27 ENCOUNTER — Telehealth: Payer: Self-pay | Admitting: *Deleted

## 2011-10-27 DIAGNOSIS — Z0289 Encounter for other administrative examinations: Secondary | ICD-10-CM

## 2011-10-27 NOTE — Telephone Encounter (Signed)
Pt needs a handicap form filled out due to her asthma. I called Dr. Willa Rough office to see if they have seen her, to get ov notes and to see if they would fill this out. They said that they have seen her but Dr. Willa Rough hasn't done her dictation from that visit yet. As soon as they get the dictation they would send it to [place]. They also stated that they do not do handicap forms.   Dr. Fabian Sharp stated that she would do a temp form for pt but it need to have a charge on it. But to please let pt know that asthma can be controlled and is not always a permanent thing.

## 2011-10-27 NOTE — Telephone Encounter (Signed)
Pt aware that form is ready to pick up.

## 2012-01-18 ENCOUNTER — Telehealth: Payer: Self-pay | Admitting: Internal Medicine

## 2012-01-18 NOTE — Telephone Encounter (Signed)
Left a message for return call.  

## 2012-01-18 NOTE — Telephone Encounter (Signed)
Pt is experiencing Cough, sick to stomach, fever,fatigue,bodyache,and chills and would like to be seen today.

## 2012-01-19 NOTE — Telephone Encounter (Signed)
Left a message for pt to return call 

## 2012-03-30 ENCOUNTER — Telehealth: Payer: Self-pay | Admitting: Internal Medicine

## 2012-03-30 NOTE — Telephone Encounter (Signed)
Pts handicap placard is getting ready to expire. Pt needs to get a new one.

## 2012-03-30 NOTE — Telephone Encounter (Signed)
Form filled out for [facility] to sign.  Waiting on response from her.

## 2012-04-02 NOTE — Telephone Encounter (Signed)
I spoke to the pt who has asked her pulmonologist to fill out this form.  The pulmonologist refers her to you.  Would you like to sign this for her?

## 2012-04-04 NOTE — Telephone Encounter (Signed)
I haven't seen this patient in a  Year.    Need ov if we are going to assess and address  handicapped form.

## 2012-04-04 NOTE — Telephone Encounter (Signed)
Left message on voicemail for the pt to return my call. 

## 2012-04-05 NOTE — Telephone Encounter (Signed)
Left message on machine requesting pt to call back to schedule appt as requested by Dr. Fabian Sharp

## 2012-04-06 NOTE — Telephone Encounter (Signed)
Left message on voicemail for the pt to call back.

## 2012-04-09 NOTE — Telephone Encounter (Signed)
Pt has appt to discuss on 04/12/12.

## 2012-04-12 ENCOUNTER — Encounter: Payer: Self-pay | Admitting: Internal Medicine

## 2012-04-12 ENCOUNTER — Ambulatory Visit (INDEPENDENT_AMBULATORY_CARE_PROVIDER_SITE_OTHER): Payer: Self-pay | Admitting: Internal Medicine

## 2012-04-12 VITALS — BP 144/80 | HR 81 | Temp 98.7°F | Wt 223.0 lb

## 2012-04-12 DIAGNOSIS — Z5189 Encounter for other specified aftercare: Secondary | ICD-10-CM | POA: Insufficient documentation

## 2012-04-12 DIAGNOSIS — E669 Obesity, unspecified: Secondary | ICD-10-CM

## 2012-04-12 DIAGNOSIS — Z5989 Other problems related to housing and economic circumstances: Secondary | ICD-10-CM | POA: Insufficient documentation

## 2012-04-12 DIAGNOSIS — Z598 Other problems related to housing and economic circumstances: Secondary | ICD-10-CM

## 2012-04-12 DIAGNOSIS — R7309 Other abnormal glucose: Secondary | ICD-10-CM

## 2012-04-12 DIAGNOSIS — E079 Disorder of thyroid, unspecified: Secondary | ICD-10-CM

## 2012-04-12 DIAGNOSIS — J45909 Unspecified asthma, uncomplicated: Secondary | ICD-10-CM

## 2012-04-12 MED ORDER — HYDROCHLOROTHIAZIDE 25 MG PO TABS: 25.0000 mg | ORAL_TABLET | Freq: Every day | ORAL | Status: DC

## 2012-04-12 NOTE — Progress Notes (Signed)
Subjective:    Patient ID: Jill Burton, female    DOB: 1946-04-07, 66 y.o.   MRN: [id number]  HPI Patient comes in today for follow up of  multiple medical problems.  Last visit was about a year ago.  shecalled for request of handicapped  Sticker form  For resp sx. Is supposed to be under pulm care. But pulmonary office referred her to primary care for filling out the form.  In regard to her asthma she thinks it is much better since she has been on Advair. However she is only taking it mostly once a day occasionally twice a day and has a rescue inhaler she was tried on to layer a but it really didn't help her and went back to Advair. She does have a concern of side effects of medicine at routine doses. She is her that it could then your bones. At this time she states is difficult and she gets short of breath when she parks at [place] to go into the store. She request handicap sticker for difficulty going more than 200 feet without resting. She takes her Singulair on a regular basis.   Currently she has no insurance but will get Medicare and ancillary in August.  She is concerned about her thyroid as she has had some abnormal tests in the past went to an alternative medicine doctor I believe Dr. Alessandra Bevels was given iodine but she is taking kelp instead. She has not had her labs checked for a year she's also taking a B complex.  Her blood pressures been elevated in the past and she is on some kind of homeopathic medicine for that. Hasn't been checked recently  She gets swelling of her feet intermittently for no reason but not associated with her breathing and takes a half to one a pill of hydrochlorothiazide. Sometimes one a day sometimes 3-4 months.  Review of Systems No feer weight loss  having a difficulty losing weight and asks about rechecking thyroid no major changes in vision no syncope.  Past history family history social history reviewed in the electronic medical record.       Objective:   Physical Exam BP 144/80  Pulse 81  Temp 98.7 F (37.1 C) (Oral)  Wt 223 lb (101.152 kg)  SpO2 95% Well-developed well-nourished in no acute distress she has less respiratory distress and I've seen her in the past when she is coming for acute visit. HEENT normocephalic OP clear nares patent Neck without bruit adenopathy or nodules. Chest clear to auscultation today no expiratory wheezes. Slightly breathy on her wheezes but no obvious accessory muscles. Cardiac S1-S2 no gallops or murmurs are heard abdomen soft without organomegaly guarding or rebound Extremities acyanotic no clubbing slight edema no unusual rashes there  Lab Results  Component Value Date   WBC 10.1 05/25/2011   HGB 14.2 05/25/2011   HCT 44.0 05/25/2011   PLT 232.0 05/25/2011   GLUCOSE 103* 05/25/2011   CHOL 215* 05/25/2011   TRIG 159.0* 05/25/2011   HDL 60.20 05/25/2011   LDLDIRECT 131.1 05/25/2011   ALT 20 05/25/2011   AST 18 05/25/2011   NA 144 05/25/2011   K 5.5* 05/25/2011   CL 106 05/25/2011   CREATININE 0.7 05/25/2011   BUN 12 05/25/2011   CO2 32 05/25/2011   TSH 0.82 10/29/2009   HGBA1C 6.3 10/29/2009       Assessment & Plan:   Asthma  under care previously by pulmonologist on controller medicine however not totally adherent  for various reasons concern about side effects and also some monetary. Sample of Advair given discussion of increasing to twice a day and risky times or when around triggers. Unclear control however she is much better than I seen her in the past. Unsure what her PFTs have been will sign a handicap sticker at this time.  Abnormal tsh in the past was under homeopathic care we'll plan repeat lab tests when Medicare kicks in and August.  On diuretic for swelling although may help with hypertension if she has this consider taking a half a pill once a day to see how she does. Needs a chemistry panel done at least yearly but this could wait until August or so as above.  History of hyperglycemia we'll  check this at her next lab draw  Difficulty  losing weight this is probably not thyroid disease but we'll check this at her followup

## 2012-04-12 NOTE — Patient Instructions (Addendum)
Your last thyroid test that I can find was in 2011 although it may have been done and sent. At that time it was okay.  Your blood pressure is slightly up today he may consider taking a half of the fluid pill every day to control your blood pressure.  You need to continue your controller medications for asthma and increase it to twice a day during risky times. Untreated asthma is serious. Do not get much medication absorbed if you've rinse her mouth out and avoid having to take pills of cortisone.   Plan laboratory studies in August and you get insurance. These will include a chemistry panel and a thyroid test. Then plan follow up after this with labs

## 2012-06-12 ENCOUNTER — Other Ambulatory Visit (INDEPENDENT_AMBULATORY_CARE_PROVIDER_SITE_OTHER): Payer: Medicare Other

## 2012-06-12 DIAGNOSIS — R946 Abnormal results of thyroid function studies: Secondary | ICD-10-CM

## 2012-06-12 DIAGNOSIS — R7309 Other abnormal glucose: Secondary | ICD-10-CM

## 2012-06-12 DIAGNOSIS — R739 Hyperglycemia, unspecified: Secondary | ICD-10-CM

## 2012-06-12 DIAGNOSIS — R609 Edema, unspecified: Secondary | ICD-10-CM

## 2012-06-12 DIAGNOSIS — R7989 Other specified abnormal findings of blood chemistry: Secondary | ICD-10-CM

## 2012-06-12 LAB — BASIC METABOLIC PANEL
BUN: 9 mg/dL (ref 6–23)
CO2: 24 mEq/L (ref 19–32)
Calcium: 9.2 mg/dL (ref 8.4–10.5)
Chloride: 107 mEq/L (ref 96–112)
Creatinine, Ser: 0.8 mg/dL (ref 0.4–1.2)
GFR: 79.95 mL/min (ref >60.00)
Glucose, Bld: 89 mg/dL (ref 70–99)
Potassium: 4.5 mEq/L (ref 3.5–5.1)
Sodium: 141 mEq/L (ref 135–145)

## 2012-06-12 LAB — HEMOGLOBIN A1C: Hgb A1c MFr Bld: 6 % (ref 4.6–6.5)

## 2012-06-12 LAB — TSH: TSH: 0.94 u[IU]/mL (ref 0.35–5.50)

## 2012-06-12 LAB — T4, FREE: Free T4: 0.92 ng/dL (ref 0.60–1.60)

## 2012-06-20 ENCOUNTER — Telehealth: Payer: Self-pay | Admitting: Internal Medicine

## 2012-06-20 NOTE — Telephone Encounter (Signed)
Patient called stating that she would like a call back with lab results and advise on when to return for follow up. Please assist.

## 2012-06-22 ENCOUNTER — Telehealth: Payer: Self-pay

## 2012-06-22 NOTE — Telephone Encounter (Signed)
Pt walked into office and wanted her lab resutls.  Pt states she was dizzy and lightheaded.  Per Dr. Fabian Sharp printed pt's labs and told pt labs were within normal range.  Pt's blood pressure taken and pt and it was 120/80.  Offered to make pt and appt with another physician pt declined and states that she will call back later.

## 2012-06-25 ENCOUNTER — Ambulatory Visit (INDEPENDENT_AMBULATORY_CARE_PROVIDER_SITE_OTHER): Payer: Medicare HMO | Admitting: Internal Medicine

## 2012-06-25 ENCOUNTER — Encounter: Payer: Self-pay | Admitting: Internal Medicine

## 2012-06-25 VITALS — BP 154/76 | HR 81 | Temp 98.2°F | Wt 227.0 lb

## 2012-06-25 DIAGNOSIS — J309 Allergic rhinitis, unspecified: Secondary | ICD-10-CM

## 2012-06-25 DIAGNOSIS — R42 Dizziness and giddiness: Secondary | ICD-10-CM

## 2012-06-25 DIAGNOSIS — R002 Palpitations: Secondary | ICD-10-CM

## 2012-06-25 DIAGNOSIS — J45909 Unspecified asthma, uncomplicated: Secondary | ICD-10-CM

## 2012-06-25 NOTE — Patient Instructions (Addendum)
Your neurologic exam and heart exam is good today. Your EKG shows no heart damage does have a minor electrical finding that might be a sign of contributing to palpitations.  We can have cardiology see you about this but I think it is safe to wait and see how you do if you have recurrent palpitations and dizziness we should do further evaluation.  The palpitations may not be related to the dizziness which sounds more like an inner ear problem.   I expect  symptoms to be a whole lot better in the next one to 2 weeks and if they are not contact our office for next advice or certainly if you're worse  Dizziness Dizziness is a common problem. It is a feeling of unsteadiness or lightheadedness. You may feel like you are about to faint. Dizziness can lead to injury if you stumble or fall. A person of any age group can suffer from dizziness, but dizziness is more common in older adults. CAUSES  Dizziness can be caused by many different things, including:  Middle ear problems.   Standing for too long.   Infections.   An allergic reaction.   Aging.   An emotional response to something, such as the sight of blood.   Side effects of medicines.   Fatigue.   Problems with circulation or blood pressure.   Excess use of alcohol, medicines, or illegal drug use.   Breathing too fast (hyperventilation).   An arrhythmia or problems with your heart rhythm.   Low red blood cell count (anemia).   Pregnancy.   Vomiting, diarrhea, fever, or other illnesses that cause dehydration.   Diseases or conditions such as Parkinson's disease, high blood pressure (hypertension), diabetes, and thyroid problems.   Exposure to extreme heat.  DIAGNOSIS  To find the cause of your dizziness, your caregiver may do a physical exam, lab tests, radiologic imaging scans, or an electrocardiography test (ECG).  TREATMENT  Treatment of dizziness depends on the cause of your symptoms and can vary greatly. HOME CARE  INSTRUCTIONS   Drink enough fluids to keep your urine clear or pale yellow. This is especially important in very hot weather. In the elderly, it is also important in cold weather.   If your dizziness is caused by medicines, take them exactly as directed. When taking blood pressure medicines, it is especially important to get up slowly.   Rise slowly from chairs and steady yourself until you feel okay.   In the morning, first sit up on the side of the bed. When this seems okay, stand slowly while holding onto something until you know your balance is fine.   If you need to stand in one place for a long time, be sure to move your legs often. Tighten and relax the muscles in your legs while standing.   If dizziness continues to be a problem, have someone stay with you for a day or two. Do this until you feel you are well enough to stay alone. Have the person call your caregiver if he or she notices changes in you that are concerning.   Do not drive or use heavy machinery if you feel dizzy.  SEEK IMMEDIATE MEDICAL CARE IF:   Your dizziness or lightheadedness gets worse.   You feel nauseous or vomit.   You develop problems with talking, walking, weakness, or using your arms, hands, or legs.   You are not thinking clearly or you have difficulty forming sentences. It may take a friend  or family member to determine if your thinking is normal.   You develop chest pain, abdominal pain, shortness of breath, or sweating.   Your vision changes.   You notice any bleeding.   You have side effects from medicine that seems to be getting worse rather than better.  MAKE SURE YOU:   Understand these instructions.   Will watch your condition.   Will get help right away if you are not doing well or get worse.  Document Released: 03/29/2001 Document Revised: 09/22/2011 Document Reviewed: 04/22/2011 [facility] Patient Information 2012 [place], [place].

## 2012-06-25 NOTE — Telephone Encounter (Signed)
Pt in the office today.  Results printed for [facility] to review with the pt.

## 2012-06-25 NOTE — Progress Notes (Signed)
  Subjective:    Patient ID: Jill Burton, female    DOB: Jun 27, 1946, 66 y.o.   MRN: [id number]  HPI Patient comes in today for SDA for  new problem evaluation. Patient comes in for acute visit today. She's been having problems with dizziness for about a week. She describes it as somewhat moving without nausea vomiting like a typical in her ear and feels off balance if she bends over. She notes it is worse when she lays down and sits up but has no syncope. She walked in the end of last week to assess for her laboratory tests and blood pressure at that time was normal.  He also complains of her rapid heart rate that comes and goes and is been going on for about the last week but not particularly associated with the dizziness episode.  She has allergies and asthma but they seem to be under control this time on her medications. She denies any falling change in hearing vision numbness tingling focal weakness or spells otherwise. She feels that she's getting better but her husband was a bit concerned about her so she came in today.  There are no headaches nausea vomiting.   Review of Systems As per history of present illness no fever chills headache numbness asthma flare. Denies any new medications. Had a rash on her neck she thinks was from some jewelry wonders if it could be related to her other symptoms.  Past history family history social history reviewed in the electronic medical record.     Objective:   Physical Exam BP 154/76  Pulse 81  Temp 98.2 F (36.8 C) (Oral)  Wt 227 lb (102.967 kg)  SpO2 98% Well-developed well-nourished healthy-appearing in no acute distress HEENT normocephalic TMs are clear eyes clear EOMs full without end gaze nystagmus nares minimal congestion of the clear tongue is midline Neck supple without masses or bruits heard Chest:  Clear to A&P without wheezes rales or rhonchi CV:  S1-S2 no gallops or murmurs peripheral perfusion is normal No clubbing  cyanosis or edema NEURO: oriented x 3 CN 3-12 appear intact. No focal muscle weakness or atrophy. DTRs symmetrical. Gait WNL.  Grossly non focal. No tremor or abnormal movement. Tandem gait is good Romberg is negative  no tremor Skin no acute changes. EKG shows normal sinus rhythm with a short PR but no delta waves. Otherwise no acute changes     Assessment & Plan:   Dizziness Uncertain etiology but it sounds vestibular  with a nonfocal exam. It apparently is getting better options discussed. Her labs from last week showed normal BMP and thyroid tests no diabetes  Palpitations episodes of racing heart states she's had this years ago but now is more recent uncertain if related to above. Discussed could be related although doesn't appear to be lasting very long and discuss short PR interval on her EKG. At this time she prefers to wait but if this isn't getting better would recommend a cardiology consult possible echo.  Allergy asthma appears quite stable today this is one of the first times I have heard her lungs were they are clear.   After patient left I noted that the amlodipine on her med list was a undisclosed provider and was noted to take it as needed.  We'll ask nursing assistant to contact her about how she is actually taking this medicine to make sure it is not related to any of her symptoms.  Uncertain if supplements could also do this

## 2012-07-02 ENCOUNTER — Telehealth: Payer: Self-pay | Admitting: Internal Medicine

## 2012-07-02 NOTE — Telephone Encounter (Signed)
Caller: [name]/Patient; Patient Name: Jill Burton; PCP: Berniece Andreas [facility]); Best Callback Phone Number: [phone]; Call regarding Ear Discharge, clear to blood in color, onset 9-15.  Patient has shooting pain in Ear.  Patient was seen on 9-9, wants to Antibiotic drops called in since she was recently seen.  Molli Knock and appointment offered, Patient denies.  [facility] [address], [phone]. Please follow up.

## 2012-07-03 ENCOUNTER — Telehealth: Payer: Self-pay | Admitting: Internal Medicine

## 2012-07-03 MED ORDER — NEOMYCIN-POLYMYXIN-HC 1 % OT SOLN: 5.0000 [drp] | OTIC | Status: DC | PRN

## 2012-07-03 NOTE — Telephone Encounter (Signed)
We took care of this?

## 2012-07-03 NOTE — Telephone Encounter (Signed)
Caller: Sharayah/Patient; Patient Name: Jill Burton; PCP: Berniece Andreas [facility]); Best Callback Phone Number: [phone].  Patient calling about right earache.  Onset 06/25/12;  seen in office that day for asthma check up and told Dr. Fabian Sharp she had some discomfort and congestion in that ear.  Ear exam clear.  Was told to see how it did over the next several days.  Has worsened over past few days, and is now more painful and congested.  Has had cold symptoms.  Afebrile.  Per ear symptoms protocol, emergent symptoms denied; advised appointment within 24 hours.  Appointment offered; declines, states since she was recently in office and discussed this, she wants antibiotic called in.   Info to office for staff/provider review/Rx/callback. [facility]. May reach patient at [phone].

## 2012-07-03 NOTE — Telephone Encounter (Signed)
I sent script e-scribe and spoke with pt. 

## 2012-07-03 NOTE — Telephone Encounter (Signed)
Pt called to check on status of getting an abx called in for ear inf that is getting worse. Pls call [facility] [address] [phone]. Pt is aware that Dr Fabian Sharp is out of office. Pt req call back today.

## 2012-07-03 NOTE — Telephone Encounter (Signed)
Can you help with this message?

## 2012-07-03 NOTE — Telephone Encounter (Signed)
Call in Cortisporin Otic suspension, 5 drops in ear q 4 hours prn, 10 ml with no rf

## 2013-03-28 ENCOUNTER — Other Ambulatory Visit: Payer: Self-pay | Admitting: Internal Medicine

## 2013-04-16 ENCOUNTER — Encounter: Payer: Self-pay | Admitting: Internal Medicine

## 2013-04-16 ENCOUNTER — Ambulatory Visit (INDEPENDENT_AMBULATORY_CARE_PROVIDER_SITE_OTHER): Payer: Medicare HMO | Admitting: Internal Medicine

## 2013-04-16 VITALS — BP 146/82 | HR 75 | Temp 98.1°F | Wt 230.0 lb

## 2013-04-16 DIAGNOSIS — J22 Unspecified acute lower respiratory infection: Secondary | ICD-10-CM | POA: Insufficient documentation

## 2013-04-16 DIAGNOSIS — Z5189 Encounter for other specified aftercare: Secondary | ICD-10-CM

## 2013-04-16 DIAGNOSIS — J988 Other specified respiratory disorders: Secondary | ICD-10-CM

## 2013-04-16 DIAGNOSIS — I10 Essential (primary) hypertension: Secondary | ICD-10-CM

## 2013-04-16 DIAGNOSIS — J45909 Unspecified asthma, uncomplicated: Secondary | ICD-10-CM

## 2013-04-16 MED ORDER — PREDNISONE 20 MG PO TABS: ORAL_TABLET | ORAL | Status: DC

## 2013-04-16 MED ORDER — AZITHROMYCIN 250 MG PO TABS: 250.0000 mg | ORAL_TABLET | ORAL | Status: DC

## 2013-04-16 MED ORDER — HYDROCODONE-HOMATROPINE 5-1.5 MG/5ML PO SYRP: 5.0000 mL | ORAL_SOLUTION | ORAL | Status: DC | PRN

## 2013-04-16 NOTE — Progress Notes (Signed)
Chief Complaint  Patient presents with  . Cough    Cough is productive of a grey color.  Cough is keeping her up at night.  Has tried Tylenol and ASA for the sore throat.  Started 5-7 days ago.  . Sore Throat    HPI: Patient comes in today for SDA for  problem evaluation. Last ov 9 13 still sees Dr. Alessandra Bevels once year with some blood work Has knowns asthma is on Advair taking it once a day. [place] and developed and has bad coughing that was worse at at night.   On albuterol helps for about 6 hours .   Coming in early to see what can be done to prevent progression into a full asthma attack. No fever   St at onset .    Getting better.  Took asa.   Husband had cough. But is better HT up a little bit. Uncertain when she had last blood work through Dr. Alessandra Bevels alternative medication  ROS: See pertinent positives and negatives per HPI. Hard time losing weight asks about going back on phentermine Past Medical History  Diagnosis Date  . Allergic rhinitis     allergy testing neg in 2011  . Asthma     Family History  Problem Relation Age of Onset  . Asthma      History   Social History  . Marital Status: Married    Spouse Name: N/A    Number of Children: N/A  . Years of Education: N/A   Social History Main Topics  . Smoking status: Never Smoker   . Smokeless tobacco: None  . Alcohol Use: None  . Drug Use: None  . Sexually Active: None   Other Topics Concern  . 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    Outpatient Encounter Prescriptions as of 04/16/2013  Medication Sig Dispense Refill  . albuterol (PROAIR HFA) 108 (90 BASE) MCG/ACT inhaler Inhale 2 puffs into the lungs every 6 (six) hours as needed.  1 Inhaler  2  . Fluticasone-Salmeterol (ADVAIR DISKUS) 100-50 MCG/DOSE AEPB Inhale 1 puff into the lungs every 12 (twelve) hours.  60 each  3  .  hydrochlorothiazide (HYDRODIURIL) 25 MG tablet TAKE 1/2 TO 1 TABLET BY MOUTH EVERY DAY  90 tablet  0  . amLODipine (NORVASC) 5 MG tablet Take 5 mg by mouth daily.      Marland Kitchen azithromycin (ZITHROMAX Z-PAK) 250 MG tablet Take 1 tablet (250 mg total) by mouth as directed. Take 2 po first day, then 1 po qd for infection .Do  Not fill after  2 weeks.  6 each  0  . HYDROcodone-homatropine (HYCODAN) 5-1.5 MG/5ML syrup Take 5 mLs by mouth every 4 (four) hours as needed for cough.  180 mL  0  . predniSONE (DELTASONE) 20 MG tablet Take 3 po qd for 2 days then 2 po qd for 3 days,or as directed  12 tablet  0  . [DISCONTINUED] montelukast (SINGULAIR) 10 MG tablet Take 10 mg by mouth at bedtime.        . [DISCONTINUED] NEOMYCIN-POLYMYXIN-HC, OTIC, (CORTISPORIN) 1 % SOLN Place 5 drops into the right ear every 4 (four) hours as needed.  10 mL  0   No facility-administered encounter medications on file as of 04/16/2013.    EXAM:  BP  146/82  Pulse 75  Temp(Src) 98.1 F (36.7 C) (Oral)  Wt 230 lb (104.327 kg)  BMI 40.75 kg/m2  SpO2 98%  Body mass index is 40.75 kg/(m^2).  GENERAL: vitals reviewed and listed above, alert, oriented, appears well hydrated and in no acute distress he is mildly congested but respirations are quiet  HEENT: atraumatic, conjunctiva  clear, no obvious abnormalities on inspection of external nose and ears mild congestion is TM intact face nontender OP : no lesion edema or exudate   NECK: no obvious masses on inspection palpation no JVD or adenopathy  LUNGS: clear to auscultation bilaterally, where wheeze at left base no wheezes, rales or rhonchi, = air movement  CV: HRRR, no clubbing cyanosis or  peripheral edema nl cap refill   MS: moves all extremities without noticeable focal  abnormality  PSYCH: pleasant and cooperative, no obvious depression or anxiety Lab Results  Component Value Date   WBC 10.1 05/25/2011   HGB 14.2 05/25/2011   HCT 44.0 05/25/2011   PLT 232.0 05/25/2011    GLUCOSE 89 06/12/2012   CHOL 215* 05/25/2011   TRIG 159.0* 05/25/2011   HDL 60.20 05/25/2011   LDLDIRECT 131.1 05/25/2011   ALT 20 05/25/2011   AST 18 05/25/2011   NA 141 06/12/2012   K 4.5 06/12/2012   CL 107 06/12/2012   CREATININE 0.8 06/12/2012   BUN 9 06/12/2012   CO2 24 06/12/2012   TSH 0.94 06/12/2012   HGBA1C 6.0 06/12/2012    ASSESSMENT AND PLAN:  Discussed the following assessment and plan:  Acute respiratory infection - prob viral but high risk   ASTHMA  Alternative medicine  hx of homeopathy meds   Unspecified essential hypertension - due for lab in august Overall she looks pretty good today considering her history of asthmatic flares in the past to stop her Advair cough medicine try to avoid prednisone burst if possible.  She will check into laboratory tests done elsewhere I she is due for lab monitoring at the end of August.  Discussed her concern about weight do not advise phentermine I believe the risk would be more than the benefit in her situation with blood pressure and age etc. and besides it is not very effective. -Patient advised to return or notify health care team  if symptoms worsen or persist or new concerns arise.  Patient Instructions  This is probably a chest cold   Probably viral and self limiting  But can flare up the asthma. Cough med at night for comfort.   INCREASE ADVAIR TO TWICE A DAY DURING ILLNESS  Can add  Antibiotic if getting  Thick  Green phlegm that is worsening   . Can add prednisone for 5 days if wheezing not controlled. Contact us if fever  Or other concerns.   You are due for yearly lab  Please schedule an appt  Ant fu by the end of AUGUST>   Get a copy of lab to [place]  Would advise  CBCdiff and BMP ( chemistry ) and  Thyroid tests.     Neta Mends. Soliyana Mcchristian M.D.

## 2013-04-16 NOTE — Patient Instructions (Addendum)
This is probably a chest cold   Probably viral and self limiting  But can flare up the asthma. Cough med at night for comfort.   INCREASE ADVAIR TO TWICE A DAY DURING ILLNESS  Can add  Antibiotic if getting  Thick  Green phlegm that is worsening   . Can add prednisone for 5 days if wheezing not controlled. Contact us if fever  Or other concerns.   You are due for yearly lab  Please schedule an appt  Ant fu by the end of AUGUST>   Get a copy of lab to [place]  Would advise  CBCdiff and BMP ( chemistry ) and  Thyroid tests.

## 2013-09-23 ENCOUNTER — Encounter: Payer: Self-pay | Admitting: Internal Medicine

## 2013-09-23 ENCOUNTER — Ambulatory Visit (INDEPENDENT_AMBULATORY_CARE_PROVIDER_SITE_OTHER): Payer: Commercial Managed Care - HMO | Admitting: Internal Medicine

## 2013-09-23 VITALS — BP 158/72 | HR 78 | Temp 98.3°F | Wt 235.0 lb

## 2013-09-23 DIAGNOSIS — K117 Disturbances of salivary secretion: Secondary | ICD-10-CM

## 2013-09-23 DIAGNOSIS — I10 Essential (primary) hypertension: Secondary | ICD-10-CM

## 2013-09-23 DIAGNOSIS — R635 Abnormal weight gain: Secondary | ICD-10-CM

## 2013-09-23 DIAGNOSIS — R682 Dry mouth, unspecified: Secondary | ICD-10-CM | POA: Insufficient documentation

## 2013-09-23 DIAGNOSIS — M25559 Pain in unspecified hip: Secondary | ICD-10-CM

## 2013-09-23 DIAGNOSIS — M25551 Pain in right hip: Secondary | ICD-10-CM

## 2013-09-23 DIAGNOSIS — Z5189 Encounter for other specified aftercare: Secondary | ICD-10-CM

## 2013-09-23 DIAGNOSIS — R7309 Other abnormal glucose: Secondary | ICD-10-CM

## 2013-09-23 LAB — CBC WITH DIFFERENTIAL/PLATELET
Basophils Absolute: 0 10*3/uL (ref 0.0–0.1)
Basophils Relative: 0.4 % (ref 0.0–3.0)
Eosinophils Absolute: 0.2 10*3/uL (ref 0.0–0.7)
Eosinophils Relative: 1.8 % (ref 0.0–5.0)
HCT: 41.5 % (ref 36.0–46.0)
Hemoglobin: 13.5 g/dL (ref 12.0–15.0)
Lymphocytes Relative: 23.5 % (ref 12.0–46.0)
Lymphs Abs: 2.3 10*3/uL (ref 0.7–4.0)
MCHC: 32.6 g/dL (ref 30.0–36.0)
MCV: 87 fl (ref 78.0–100.0)
Monocytes Absolute: 0.5 10*3/uL (ref 0.1–1.0)
Monocytes Relative: 4.8 % (ref 3.0–12.0)
Neutro Abs: 6.9 10*3/uL (ref 1.4–7.7)
Neutrophils Relative %: 69.5 % (ref 43.0–77.0)
Platelets: 245 10*3/uL (ref 150.0–400.0)
RBC: 4.76 Mil/uL (ref 3.87–5.11)
RDW: 14.6 % (ref 11.5–14.6)
WBC: 9.9 10*3/uL (ref 4.5–10.5)

## 2013-09-23 LAB — LIPID PANEL
Cholesterol: 234 mg/dL — ABNORMAL HIGH (ref 0–200)
HDL: 55.6 mg/dL (ref >39.00)
Total CHOL/HDL Ratio: 4
Triglycerides: 147 mg/dL (ref 0.0–149.0)
VLDL: 29.4 mg/dL (ref 0.0–40.0)

## 2013-09-23 LAB — TSH: TSH: 0.83 u[IU]/mL (ref 0.35–5.50)

## 2013-09-23 LAB — BASIC METABOLIC PANEL
BUN: 10 mg/dL (ref 6–23)
CO2: 28 mEq/L (ref 19–32)
Calcium: 9.3 mg/dL (ref 8.4–10.5)
Chloride: 106 mEq/L (ref 96–112)
Creatinine, Ser: 0.8 mg/dL (ref 0.4–1.2)
GFR: 80.85 mL/min (ref >60.00)
Glucose, Bld: 108 mg/dL — ABNORMAL HIGH (ref 70–99)
Potassium: 4.2 mEq/L (ref 3.5–5.1)
Sodium: 142 mEq/L (ref 135–145)

## 2013-09-23 LAB — POCT URINALYSIS DIP (MANUAL ENTRY)
Bilirubin, UA: NEGATIVE
Blood, UA: NEGATIVE
Glucose, UA: NEGATIVE
Ketones, POC UA: NEGATIVE
Nitrite, UA: NEGATIVE
Protein Ur, POC: NEGATIVE
Spec Grav, UA: 1.025
Urobilinogen, UA: 0.2
pH, UA: 5.5

## 2013-09-23 LAB — HEPATIC FUNCTION PANEL
ALT: 20 U/L (ref 0–35)
AST: 20 U/L (ref 0–37)
Albumin: 4.1 g/dL (ref 3.5–5.2)
Alkaline Phosphatase: 80 U/L (ref 39–117)
Bilirubin, Direct: 0 mg/dL (ref 0.0–0.3)
Total Bilirubin: 0.4 mg/dL (ref 0.3–1.2)
Total Protein: 7.1 g/dL (ref 6.0–8.3)

## 2013-09-23 LAB — HEMOGLOBIN A1C: Hgb A1c MFr Bld: 6.3 % (ref 4.6–6.5)

## 2013-09-23 LAB — T4, FREE: Free T4: 0.81 ng/dL (ref 0.60–1.60)

## 2013-09-23 LAB — SEDIMENTATION RATE: Sed Rate: 11 mm/hr (ref 0–22)

## 2013-09-23 NOTE — Progress Notes (Addendum)
Chief Complaint  Patient presents with  . Joint Pain    Joint pain ongoing for 3 years.  Dry mouth every night.  Would also like to see a nutritionist.  . Dry Mouth    HPI: Patient comes in today for SDA for  new problems evaluation. Problem with right groin and hip and now left  Tripped over truer yeas ago and has had right hip joijt problem . Thinks that she's getting recurrent problems related to this  Now acting up? If something in right side area. Hard to stopp without pain .  ? What to do . No falling or recent injury no radiation to his leg no associated fever numbness or weakness.  Veins last night  Right leg. Had a discoloration and staining and a small area is gone today and no edema  Has dry mouth in the morning for the last few weeks almost choking drinks water.  No dysphasia in the day no change in medication  Blood pressure ; medications have been given to her by Dr. Tarry Kos taking bp medication cause  her blood pressure Was better( amlodipine 5 mg she is also taking a diuretic) as needed given for swelling was not aware she would be taking this for blood pressure fiven to 7/ 14 was the date of the last refill of the amlodipine.  She is continuing to gain some weight asks that the nutrition dietitian consult would be helpful.  Asthma pretty stable using inhalers. No change recently.   ROS: See pertinent positives and negatives per HPI. No current chest pain shortness of breath syncope hand arthritis symptoms.  Past Medical History  Diagnosis Date  . Allergic rhinitis     allergy testing neg in 2011  . Asthma     Family History  Problem Relation Age of Onset  . Asthma      History   Social History  . Marital Status: Married    Spouse Name: N/A    Number of Children: N/A  . Years of Education: N/A   Social History Main Topics  . Smoking status: Never Smoker   . Smokeless tobacco: None  . Alcohol Use: None  . Drug Use: None  . Sexual Activity:  None   Other Topics Concern  . 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    Outpatient Encounter Prescriptions as of 09/23/2013  Medication Sig  . albuterol (PROAIR HFA) 108 (90 BASE) MCG/ACT inhaler Inhale 2 puffs into the lungs every 6 (six) hours as needed.  Marland Kitchen amLODipine (NORVASC) 5 MG tablet Take 5 mg by mouth daily.  . Fluticasone-Salmeterol (ADVAIR DISKUS) 100-50 MCG/DOSE AEPB Inhale 1 puff into the lungs every 12 (twelve) hours.  . hydrochlorothiazide (HYDRODIURIL) 25 MG tablet TAKE 1/2 TO 1 TABLET BY MOUTH EVERY DAY  . [DISCONTINUED] azithromycin (ZITHROMAX Z-PAK) 250 MG tablet Take 1 tablet (250 mg total) by mouth as directed. Take 2 po first day, then 1 po qd for infection .Do  Not fill after  2 weeks.  . [DISCONTINUED] HYDROcodone-homatropine (HYCODAN) 5-1.5 MG/5ML syrup Take 5 mLs by mouth every 4 (four) hours as needed for cough.  . [DISCONTINUED] predniSONE (DELTASONE) 20 MG tablet Take 3 po qd for 2 days then 2 po qd for 3 days,or as directed    EXAM:  BP 158/72  Pulse 78  Temp(Src) 98.3 F (36.8  C) (Oral)  Wt 235 lb (106.595 kg)  SpO2 97%  Body mass index is 41.64 kg/(m^2).  GENERAL: vitals reviewed and listed above, alert, oriented, appears well hydrated and in no acute distress HEENT: atraumatic, conjunctiva  clear, no obvious abnormalities on inspection of external nose and ears OP : no lesion edema or exudate  partiasl no lesions tongue moist  NECK: no obvious masses on inspection palpation no bruits are noted LUNGS: clear to auscultation bilaterally, no wheezes, rales or rhonchi, good air movement CV: HRRR, no clubbing cyanosis or  peripheral edema nl cap refill  Abdomen no obvious masses hepatosplenomegaly negative psoas sign MS: moves all extremities without noticeable focal  abnormality Right groin pain   Gait mildy antalgic  Back of leg no chord  Lesion mass or tendernss  politeal fossa area  PSYCH: pleasant and cooperative, no obvious depression or anxiety Lab Results  Component Value Date   WBC 10.1 05/25/2011   HGB 14.2 05/25/2011   HCT 44.0 05/25/2011   PLT 232.0 05/25/2011   GLUCOSE 89 06/12/2012   CHOL 215* 05/25/2011   TRIG 159.0* 05/25/2011   HDL 60.20 05/25/2011   LDLDIRECT 131.1 05/25/2011   ALT 20 05/25/2011   AST 18 05/25/2011   NA 141 06/12/2012   K 4.5 06/12/2012   CL 107 06/12/2012   CREATININE 0.8 06/12/2012   BUN 9 06/12/2012   CO2 24 06/12/2012   TSH 0.94 06/12/2012   HGBA1C 6.0 06/12/2012    ASSESSMENT AND PLAN:  Discussed the following assessment and plan:  Hip pain, right - will call us about poss referral vs appt on her own. I advise she see ortho - Plan: Basic metabolic panel, CBC with Differential, Hemoglobin A1c, Hepatic function panel, Lipid panel, TSH, T4, free, POCT urinalysis dipstick, ANA, Sedimentation rate, Cyclic citrul peptide antibody, IgG  Dry mouth - at night .  - Plan: Basic metabolic panel, CBC with Differential, Hemoglobin A1c, Hepatic function panel, Lipid panel, TSH, T4, free, POCT urinalysis dipstick, ANA, Sedimentation rate, Cyclic citrul peptide antibody, IgG  Unspecified essential hypertension - med given dr v she stopped cause bp better  back up - Plan: Basic metabolic panel, CBC with Differential, Hemoglobin A1c, Hepatic function panel, Lipid panel, TSH, T4, free, POCT urinalysis dipstick, ANA, Sedimentation rate, Cyclic citrul peptide antibody, IgG, Amb ref to Medical Nutrition Therapy-MNT  Other abnormal glucose - Plan: Amb ref to Medical Nutrition Therapy-MNT  Weight gain - Plan: Amb ref to Medical Nutrition Therapy-MNT  Alternative medicine Multiple complaints today but mostly related to her right groin pain which I believe is hip related because of her persistent symptoms over time I suggest she see an orthopedist names were given she'll call us if she needs a referral. Her asthma appears to be stable at this time her  hypertension is being treated by another doctor but she stopped her medicine because she was getting better have told her to contact the other Dr. about management but I think that she should go back on medication. We'll do referral for nutrition consultation because of her weight gain. Dry mouth could be related to upper airway and not systemic disease ; she is not taking diuretic on a regular basis. Discussed alarm symptoms we'll rule out metabolic diabetes today. -Patient advised to return or notify health care team  if symptoms worsen or persist or new concerns arise.  Patient Instructions   Lets  Get blood tests .  Today  It is possible that you have  a sleep  Or other problem  Effecting  fatigue dry mouth if sleeping with mouth open ? Sleep apnea.  Make sure  bp is  Below 140/90  Discuss with dr Alessandra Bevels about the bp Since she wrote the medication.   Consider seeing  Orthopedist  About the hip groin pain .   Can do nutrition referral to help with  Weight control  Wt Readings from Last 3 Encounters:  09/23/13 235 lb (106.595 kg)  04/16/13 230 lb (104.327 kg)  06/25/12 227 lb (102.967 kg)     Neta Mends. Tajuanna Burnett M.D.  Pre visit review using our clinic review tool, if applicable. No additional management support is needed unless otherwise documented below in the visit note.

## 2013-09-23 NOTE — Patient Instructions (Addendum)
Lets  Get blood tests .  Today  It is possible that you have a sleep  Or other problem  Effecting  fatigue dry mouth if sleeping with mouth open ? Sleep apnea.  Make sure  bp is  Below 140/90  Discuss with dr Alessandra Bevels about the bp Since she wrote the medication.   Consider seeing  Orthopedist  About the hip groin pain .   Can do nutrition referral to help with  Weight control  Wt Readings from Last 3 Encounters:  09/23/13 235 lb (106.595 kg)  04/16/13 230 lb (104.327 kg)  06/25/12 227 lb (102.967 kg)

## 2013-09-24 LAB — CYCLIC CITRUL PEPTIDE ANTIBODY, IGG: Cyclic Citrullin Peptide Ab: <2 U/mL (ref 0.0–5.0)

## 2013-09-24 LAB — LDL CHOLESTEROL, DIRECT: Direct LDL: 157.4 mg/dL

## 2013-09-24 LAB — ANA: Anti Nuclear Antibody(ANA): NEGATIVE

## 2013-09-25 ENCOUNTER — Telehealth: Payer: Self-pay | Admitting: Internal Medicine

## 2013-09-25 DIAGNOSIS — I10 Essential (primary) hypertension: Secondary | ICD-10-CM

## 2013-09-25 DIAGNOSIS — R635 Abnormal weight gain: Secondary | ICD-10-CM

## 2013-09-25 DIAGNOSIS — R7309 Other abnormal glucose: Secondary | ICD-10-CM

## 2013-09-25 NOTE — Telephone Encounter (Signed)
Thought i already tdid this  Will do

## 2013-09-25 NOTE — Telephone Encounter (Signed)
Pt would like a referral to nutrition due to wt gain. Pt was seen on 09-23-13

## 2013-09-26 NOTE — Telephone Encounter (Signed)
This has been entered in the system.

## 2013-10-13 ENCOUNTER — Other Ambulatory Visit: Payer: Self-pay | Admitting: Internal Medicine

## 2013-10-22 ENCOUNTER — Ambulatory Visit
Admission: RE | Admit: 2013-10-22 | Discharge: 2013-10-22 | Disposition: A | Discharge status: Home / Self Care | Payer: Medicare HMO | Source: Ambulatory Visit | Attending: Orthopedic Surgery | Admitting: Orthopedic Surgery

## 2013-10-22 ENCOUNTER — Other Ambulatory Visit: Payer: Self-pay | Admitting: Orthopedic Surgery

## 2013-10-22 ENCOUNTER — Ambulatory Visit
Admission: RE | Admit: 2013-10-22 | Discharge: 2013-10-22 | Disposition: A | Discharge status: Home / Self Care | Payer: Commercial Managed Care - HMO | Source: Ambulatory Visit | Attending: Orthopedic Surgery | Admitting: Orthopedic Surgery

## 2013-10-22 DIAGNOSIS — R1031 Right lower quadrant pain: Secondary | ICD-10-CM

## 2013-10-22 MED ORDER — IOHEXOL 300 MG/ML  SOLN
125.0000 mL | Freq: Once | INTRAMUSCULAR | Status: AC | PRN
  Administered 2013-10-22: 125 mL via INTRAVENOUS

## 2013-10-28 ENCOUNTER — Telehealth: Payer: Self-pay | Admitting: Internal Medicine

## 2013-10-28 NOTE — Telephone Encounter (Signed)
Pt notified.  She will contact Dr. Eilleen KempfVoytek's office.

## 2013-10-28 NOTE — Telephone Encounter (Signed)
Pt needs a referral to see dr cousins only [phone] for abn ct scan. Pt has [employer] medic[employer] ins.

## 2013-10-28 NOTE — Telephone Encounter (Signed)
Gynecology does not require a referral.  Dr. Fabian SharpPanosh did not order the CT.  It was ordered by Lunette StandsAnna Voytek.  All orders should come from that doctor.

## 2013-11-06 ENCOUNTER — Ambulatory Visit: Payer: Medicare HMO | Admitting: *Deleted

## 2013-11-06 ENCOUNTER — Telehealth: Payer: Self-pay | Admitting: Internal Medicine

## 2013-11-07 ENCOUNTER — Telehealth: Payer: Self-pay | Admitting: Internal Medicine

## 2013-11-07 NOTE — Telephone Encounter (Signed)
Error/gd °

## 2013-11-07 NOTE — Telephone Encounter (Signed)
Pt would like a copy of the referral sent to Dr Cherly Hensenousins office for her records.  OK to email to her at:  [email or web address] Is this something you can do?  If you want to print off, I will sent it for you.

## 2013-11-08 ENCOUNTER — Other Ambulatory Visit: Payer: Self-pay | Admitting: Obstetrics and Gynecology

## 2013-11-11 ENCOUNTER — Encounter (HOSPITAL_COMMUNITY): Payer: Self-pay | Admitting: Pharmacist

## 2013-11-18 NOTE — Patient Instructions (Addendum)
   Your procedure is scheduled on:  Thursday, Feb 5  [facility] at: 6 AM Pick up the phone at the desk and dial [phone] and inform us of your arrival.  Please call this number if you have any problems the morning of surgery: [phone]  Remember: Do not eat or drink after midnight: Wednesday Take these medicines the morning of surgery with a SIP OF WATER:  Bring rescue inhaler with you to hospital on day of surgery.  Take amlodipine, HCTZ \ Do not wear jewelry, make-up, or FINGER nail polish No metal in your hair or on your body. Do not wear lotions, powders, perfumes.  You may wear deodorant.  Do not bring valuables to the hospital. Contacts, dentures or bridgework may not be worn into surgery.  Patients discharged on the day of surgery will not be allowed to drive home.  Home with husband Rev ClaringtonHunter.

## 2013-11-19 ENCOUNTER — Encounter (HOSPITAL_COMMUNITY): Payer: Self-pay

## 2013-11-19 ENCOUNTER — Encounter (HOSPITAL_COMMUNITY)
Admission: RE | Admit: 2013-11-19 | Discharge: 2013-11-19 | Disposition: A | Discharge status: Home / Self Care | Payer: Medicare HMO | Source: Ambulatory Visit | Attending: Obstetrics and Gynecology | Admitting: Obstetrics and Gynecology

## 2013-11-19 HISTORY — DX: Anemia, unspecified: D64.9

## 2013-11-19 HISTORY — DX: Hyperlipidemia, unspecified: E78.5

## 2013-11-19 HISTORY — DX: Essential (primary) hypertension: I10

## 2013-11-19 LAB — CBC
HCT: 42.7 % (ref 36.0–46.0)
Hemoglobin: 13.9 g/dL (ref 12.0–15.0)
MCH: 28.4 pg (ref 26.0–34.0)
MCHC: 32.6 g/dL (ref 30.0–36.0)
MCV: 87.3 fL (ref 78.0–100.0)
Platelets: 224 10*3/uL (ref 150–400)
RBC: 4.89 MIL/uL (ref 3.87–5.11)
RDW: 13.8 % (ref 11.5–15.5)
WBC: 9.1 10*3/uL (ref 4.0–10.5)

## 2013-11-19 LAB — BASIC METABOLIC PANEL
BUN: 12 mg/dL (ref 6–23)
CO2: 28 mEq/L (ref 19–32)
Calcium: 9.5 mg/dL (ref 8.4–10.5)
Chloride: 104 mEq/L (ref 96–112)
Creatinine, Ser: 0.76 mg/dL (ref 0.50–1.10)
GFR calc Af Amer: >90 mL/min (ref >90)
GFR calc non Af Amer: 86 mL/min — ABNORMAL LOW (ref >90)
Glucose, Bld: 107 mg/dL — ABNORMAL HIGH (ref 70–99)
Potassium: 4.5 mEq/L (ref 3.7–5.3)
Sodium: 142 mEq/L (ref 137–147)

## 2013-11-20 NOTE — Anesthesia Preprocedure Evaluation (Addendum)
Anesthesia Evaluation  Patient identified by MRN, date of birth, ID band Patient awake    Reviewed: Allergy & Precautions, H&P , NPO status , Patient's Chart, lab work & pertinent test results  Airway Mallampati: II TM Distance: >3 FB Neck ROM: Full    Dental  (+) Dental Advisory Given   Pulmonary asthma ,  breath sounds clear to auscultation        Cardiovascular hypertension, Pt. on medications Rhythm:Regular Rate:Normal     Neuro/Psych negative neurological ROS  negative psych ROS   GI/Hepatic negative GI ROS, Neg liver ROS,   Endo/Other  Morbid obesity  Renal/GU negative Renal ROS     Musculoskeletal negative musculoskeletal ROS (+)   Abdominal (+) + obese,   Peds  Hematology negative hematology ROS (+) anemia ,   Anesthesia Other Findings   Reproductive/Obstetrics negative OB ROS                          Anesthesia Physical Anesthesia Plan  ASA: III  Anesthesia Plan: General   Post-op Pain Management:    Induction: Intravenous  Airway Management Planned: LMA  Additional Equipment:   Intra-op Plan:   Post-operative Plan:   Informed Consent: I have reviewed the patients History and Physical, chart, labs and discussed the procedure including the risks, benefits and alternatives for the proposed anesthesia with the patient or authorized representative who has indicated his/her understanding and acceptance.   Dental advisory given  Plan Discussed with: CRNA  Anesthesia Plan Comments:         Anesthesia Quick Evaluation

## 2013-11-21 ENCOUNTER — Encounter (HOSPITAL_COMMUNITY)
Admission: RE | Disposition: A | Discharge status: Home / Self Care | Payer: Self-pay | Source: Ambulatory Visit | Attending: Obstetrics and Gynecology

## 2013-11-21 ENCOUNTER — Ambulatory Visit (HOSPITAL_COMMUNITY): Payer: Medicare HMO | Admitting: Anesthesiology

## 2013-11-21 ENCOUNTER — Encounter (HOSPITAL_COMMUNITY): Payer: Medicare HMO | Admitting: Anesthesiology

## 2013-11-21 ENCOUNTER — Ambulatory Visit (HOSPITAL_COMMUNITY)
Admission: RE | Admit: 2013-11-21 | Discharge: 2013-11-21 | Disposition: A | Discharge status: Home / Self Care | Payer: Medicare HMO | Source: Ambulatory Visit | Attending: Obstetrics and Gynecology | Admitting: Obstetrics and Gynecology

## 2013-11-21 ENCOUNTER — Encounter (HOSPITAL_COMMUNITY): Payer: Self-pay | Admitting: *Deleted

## 2013-11-21 DIAGNOSIS — N840 Polyp of corpus uteri: Secondary | ICD-10-CM | POA: Insufficient documentation

## 2013-11-21 DIAGNOSIS — R9389 Abnormal findings on diagnostic imaging of other specified body structures: Secondary | ICD-10-CM | POA: Insufficient documentation

## 2013-11-21 DIAGNOSIS — N9489 Other specified conditions associated with female genital organs and menstrual cycle: Secondary | ICD-10-CM

## 2013-11-21 HISTORY — PX: DILITATION & CURRETTAGE/HYSTROSCOPY WITH VERSAPOINT RESECTION: SHX5571

## 2013-11-21 SURGERY — DILATATION & CURETTAGE/HYSTEROSCOPY WITH VERSAPOINT RESECTION
Anesthesia: General | Site: Uterus

## 2013-11-21 MED ORDER — FENTANYL CITRATE 0.05 MG/ML IJ SOLN
25.0000 ug | INTRAMUSCULAR | Status: DC | PRN
  Administered 2013-11-21: 50 ug via INTRAVENOUS

## 2013-11-21 MED ORDER — CHLOROPROCAINE HCL 1 % IJ SOLN
INTRAMUSCULAR | Status: AC
  Filled 2013-11-21: qty 30

## 2013-11-21 MED ORDER — PROPOFOL 10 MG/ML IV BOLUS
INTRAVENOUS | Status: DC | PRN
  Administered 2013-11-21: 250 mg via INTRAVENOUS

## 2013-11-21 MED ORDER — KETOROLAC TROMETHAMINE 30 MG/ML IJ SOLN: 15.0000 mg | Freq: Once | INTRAMUSCULAR | Status: DC | PRN

## 2013-11-21 MED ORDER — PROPOFOL 10 MG/ML IV EMUL
INTRAVENOUS | Status: AC
  Filled 2013-11-21: qty 20

## 2013-11-21 MED ORDER — EPHEDRINE 5 MG/ML INJ
INTRAVENOUS | Status: AC
  Filled 2013-11-21: qty 10

## 2013-11-21 MED ORDER — KETOROLAC TROMETHAMINE 30 MG/ML IJ SOLN
INTRAMUSCULAR | Status: DC | PRN
  Administered 2013-11-21: 30 mg via INTRAMUSCULAR
  Administered 2013-11-21: 30 mg via INTRAVENOUS

## 2013-11-21 MED ORDER — IBUPROFEN 800 MG PO TABS: 800.0000 mg | ORAL_TABLET | Freq: Three times a day (TID) | ORAL | Status: DC | PRN

## 2013-11-21 MED ORDER — ONDANSETRON HCL 4 MG/2ML IJ SOLN
INTRAMUSCULAR | Status: DC | PRN
  Administered 2013-11-21: 4 mg via INTRAVENOUS

## 2013-11-21 MED ORDER — GLYCOPYRROLATE 0.2 MG/ML IJ SOLN
INTRAMUSCULAR | Status: AC
  Filled 2013-11-21: qty 1

## 2013-11-21 MED ORDER — MIDAZOLAM HCL 2 MG/2ML IJ SOLN
INTRAMUSCULAR | Status: AC
  Filled 2013-11-21: qty 2

## 2013-11-21 MED ORDER — MEPERIDINE HCL 25 MG/ML IJ SOLN: 6.2500 mg | INTRAMUSCULAR | Status: DC | PRN

## 2013-11-21 MED ORDER — FENTANYL CITRATE 0.05 MG/ML IJ SOLN
INTRAMUSCULAR | Status: AC
  Filled 2013-11-21: qty 5

## 2013-11-21 MED ORDER — LIDOCAINE HCL (CARDIAC) 20 MG/ML IV SOLN
INTRAVENOUS | Status: DC | PRN
  Administered 2013-11-21: 80 mg via INTRAVENOUS

## 2013-11-21 MED ORDER — LIDOCAINE HCL (CARDIAC) 20 MG/ML IV SOLN
INTRAVENOUS | Status: AC
  Filled 2013-11-21: qty 5

## 2013-11-21 MED ORDER — ONDANSETRON HCL 4 MG/2ML IJ SOLN
INTRAMUSCULAR | Status: AC
  Filled 2013-11-21: qty 2

## 2013-11-21 MED ORDER — SODIUM CHLORIDE 0.9 % IR SOLN
Status: DC | PRN
Start: 2013-11-21 — End: 2013-11-21
  Administered 2013-11-21 (×4): 3000 mL

## 2013-11-21 MED ORDER — METOCLOPRAMIDE HCL 5 MG/ML IJ SOLN: 10.0000 mg | Freq: Once | INTRAMUSCULAR | Status: DC | PRN

## 2013-11-21 MED ORDER — KETOROLAC TROMETHAMINE 30 MG/ML IJ SOLN
INTRAMUSCULAR | Status: AC
  Filled 2013-11-21: qty 1

## 2013-11-21 MED ORDER — LACTATED RINGERS IV SOLN
INTRAVENOUS | Status: DC
  Administered 2013-11-21: 07:00:00 via INTRAVENOUS
  Administered 2013-11-21: 1000 mL via INTRAVENOUS

## 2013-11-21 MED ORDER — DEXAMETHASONE SODIUM PHOSPHATE 4 MG/ML IJ SOLN
INTRAMUSCULAR | Status: DC | PRN
  Administered 2013-11-21: 5 mg via INTRAVENOUS

## 2013-11-21 MED ORDER — FENTANYL CITRATE 0.05 MG/ML IJ SOLN
INTRAMUSCULAR | Status: DC | PRN
  Administered 2013-11-21: 100 ug via INTRAVENOUS
  Administered 2013-11-21 (×2): 50 ug via INTRAVENOUS

## 2013-11-21 MED ORDER — MIDAZOLAM HCL 2 MG/2ML IJ SOLN
INTRAMUSCULAR | Status: DC | PRN
  Administered 2013-11-21: 1 mg via INTRAVENOUS

## 2013-11-21 MED ORDER — FENTANYL CITRATE 0.05 MG/ML IJ SOLN
INTRAMUSCULAR | Status: AC
  Administered 2013-11-21: 50 ug via INTRAVENOUS
  Filled 2013-11-21: qty 2

## 2013-11-21 MED ORDER — PROPOFOL 10 MG/ML IV EMUL
INTRAVENOUS | Status: AC
  Filled 2013-11-21: qty 40

## 2013-11-21 MED ORDER — DEXAMETHASONE SODIUM PHOSPHATE 10 MG/ML IJ SOLN
INTRAMUSCULAR | Status: AC
  Filled 2013-11-21: qty 1

## 2013-11-21 SURGICAL SUPPLY — 17 items
CANISTER SUCT 3000ML (MISCELLANEOUS) ×2 IMPLANT
CATH ROBINSON RED A/P 16FR (CATHETERS) ×2 IMPLANT
CLOTH BEACON ORANGE TIMEOUT ST (SAFETY) ×2 IMPLANT
CONTAINER PREFILL 10% NBF 60ML (FORM) ×2 IMPLANT
DRSG TELFA 3X8 NADH (GAUZE/BANDAGES/DRESSINGS) ×2 IMPLANT
ELECT REM PT RETURN 9FT ADLT (ELECTROSURGICAL) ×2
ELECTRODE REM PT RTRN 9FT ADLT (ELECTROSURGICAL) ×1 IMPLANT
ELECTRODE ROLLER VERSAPOINT (ELECTRODE) IMPLANT
ELECTRODE RT ANGLE VERSAPOINT (CUTTING LOOP) ×2 IMPLANT
GLOVE BIOGEL PI IND STRL 7.0 (GLOVE) ×3 IMPLANT
GLOVE BIOGEL PI INDICATOR 7.0 (GLOVE) ×3
GLOVE ECLIPSE 6.5 STRL STRAW (GLOVE) ×4 IMPLANT
GOWN STRL REUS W/TWL LRG LVL3 (GOWN DISPOSABLE) ×4 IMPLANT
PACK HYSTEROSCOPY LF (CUSTOM PROCEDURE TRAY) ×2 IMPLANT
PAD OB MATERNITY 4.3X12.25 (PERSONAL CARE ITEMS) ×2 IMPLANT
TOWEL OR 17X24 6PK STRL BLUE (TOWEL DISPOSABLE) ×4 IMPLANT
WATER STERILE IRR 1000ML POUR (IV SOLUTION) IMPLANT

## 2013-11-21 NOTE — Anesthesia Postprocedure Evaluation (Signed)
  Anesthesia Post-op Note  Anesthesia Post Note  Patient: Jill Burton  Procedure(s) Performed: Procedure(s) (LRB): DILATATION & CURETTAGE/HYSTEROSCOPY WITH VERSAPOINT RESECTION (N/A)  Anesthesia type: General  Patient location: PACU  Post pain: Pain level controlled  Post assessment: Post-op Vital signs reviewed  Last Vitals:  Filed Vitals:   11/21/13 0930  BP: 135/63  Pulse: 60  Temp: 36.9 C  Resp: 18    Post vital signs: Reviewed  Level of consciousness: sedated  Complications: No apparent anesthesia complications

## 2013-11-21 NOTE — Discharge Instructions (Signed)
CALL  IF TEMP>100.4, NOTHING PER VAGINA X 1 WK, CALL IF SOAKING A MAXI  PAD EVERY HOUR OR MORE FREQUENTLY DISCHARGE INSTRUCTIONS: D&C  . MAY TAKE IBUPROFEN (MOTRIN, ADVIL) OR ALEVE AFTER 2:30 PM FOR CRAMPS!!   Personal hygiene:  Use sanitary pads for vaginal drainage, not tampons.  Shower the day after your procedure.  NO tub baths, pools or Jacuzzis for 2-3 weeks.  Wipe front to back after using the bathroom.  Activity and limitations:  Do NOT drive or operate any equipment for 24 hours. The effects of anesthesia are still present and drowsiness may result.  Do NOT rest in bed all day.  Walking is encouraged.  Walk up and down stairs slowly.  You may resume your normal activity in one to two days or as indicated by your physician.  Sexual activity: NO intercourse for at least 2 weeks after the procedure, or as indicated by your physician.  Diet: Eat a light meal as desired this evening. You may resume your usual diet tomorrow.  Return to work: You may resume your work activities in one to two days or as indicated by your doctor.  What to expect after your surgery: Expect to have vaginal bleeding/discharge for 2-3 days and spotting for up to 10 days. It is not unusual to have soreness for up to 1-2 weeks. You may have a slight burning sensation when you urinate for the first day. Mild cramps may continue for a couple of days. You may have a regular period in 2-6 weeks.  Call your doctor for any of the following:  Excessive vaginal bleeding, saturating and changing one pad every hour.  Inability to urinate 6 hours after discharge from hospital.  Pain not relieved by pain medication.  Fever of 100.4 F or greater.  Unusual vaginal discharge or odor.   Call for an appointment:    Patients signature: ______________________  Nurses signature ________________________  Support person's signature_______________________

## 2013-11-21 NOTE — Transfer of Care (Signed)
Immediate Anesthesia Transfer of Care Note  Patient: Jill Burton  Procedure(s) Performed: Procedure(s): DILATATION & CURETTAGE/HYSTEROSCOPY WITH VERSAPOINT RESECTION (N/A)  Patient Location: PACU  Anesthesia Type:General  Level of Consciousness: sedated  Airway & Oxygen Therapy: Patient Spontanous Breathing and Patient connected to nasal cannula oxygen  Post-op Assessment: Report given to PACU RN and Post -op Vital signs reviewed and stable  Post vital signs: stable  Complications: No apparent anesthesia complications

## 2013-11-21 NOTE — Brief Op Note (Signed)
11/21/2013  8:47 AM  PATIENT:  Sigmund HazelJuanita W Biggs  68 y.o. female  PRE-OPERATIVE DIAGNOSIS:  Postmenopause, Endometrial Mass  POST-OPERATIVE DIAGNOSIS:  Postmenopause,  Endometrial Mass  PROCEDURE:  Diagnostic hysteroscopy, hysteroscopic resection of large endometrial masses, dilation and curettage  SURGEON:  Surgeon(s) and Role:    * Handy Mcloud Cathie BeamsA Karenna Romanoff, MD - Primary  PHYSICIAN ASSISTANT:   ASSISTANTS: none   ANESTHESIA:   general Findings: large ant pedunculated mass extended through upper internal os. Right lateral smaller mass EBL:  Total I/O In: -  Out: 50 [Blood:50]  BLOOD ADMINISTERED:none  DRAINS: none   LOCAL MEDICATIONS USED:  NONE  SPECIMEN:  Source of Specimen:  endometrial resctions and endometrial curettings  DISPOSITION OF SPECIMEN:  PATHOLOGY  COUNTS:  YES  TOURNIQUET:  * No tourniquets in log *  DICTATION: .Other Dictation: Dictation Number [id number]  PLAN OF CARE: Discharge to home after PACU  PATIENT DISPOSITION:  PACU - hemodynamically stable.   Delay start of Pharmacological VTE agent (>24hrs) due to surgical blood loss or risk of bleeding: no

## 2013-11-22 ENCOUNTER — Encounter (HOSPITAL_COMMUNITY): Payer: Self-pay | Admitting: Obstetrics and Gynecology

## 2013-11-22 NOTE — Op Note (Signed)
NAMCollene Gobble:  Jill Burton, Jill Burton              ACCOUNT NO.:  [id number]  MEDICAL RECORD NO.:  [id number]  LOCATION:  [facility]                          FACILITY:  [facility]  PHYSICIAN:  Maxie BetterSheronette Mardell Suttles, M.D.DATE OF BIRTH:  [birth year]  DATE OF PROCEDURE:  11/21/2013 DATE OF DISCHARGE:  11/21/2013                              OPERATIVE REPORT   PREOPERATIVE DIAGNOSIS:  Endometrial mass, abdominal pain.  PROCEDURES:  Diagnostic hysteroscopy, hysteroscopic resection of endometrial mass using the VersaPoint, dilation and curettage.  POSTOPERATIVE DIAGNOSIS:  Large endometrial mass, abdominal pain.  ANESTHESIA:  General.  SURGEON:  Maxie BetterSheronette Jerae Izard, MD  ASSISTANT:  None.  PROCEDURE NOTE:  Under adequate general anesthesia, the patient was placed in the dorsal lithotomy position.  She was sterilely prepped and draped in usual fashion.  The bladder was catheterized for moderate amount of urine.  Examination under anesthesia revealed anteverted uterus.  No adnexal masses could be appreciated.  A bivalve speculum was placed in vagina.  Single-tooth tenaculum was placed on the anterior lip of the cervix.  The cervix was easily accepted #31 Pratt dilator.  A VersaPoint resectoscope was inserted with a single loop on entering the uterine cavity.  At the entrance of the internal os, was a large polypoid pedunculated lesion which on visualization on the lateral side took up the entire cavity sterilely anteriorly and there was a smaller lesion in the right cornual area pedunculated as well.  The tubal ostia, neither of them could be seen.  Using the single loop and starting at the proximal portion of the largest mass, the resection was continued until the base was reached which was noted to be anterior fundal.  The smaller mass on the right was also resected.  When all the resected pieces were removed, resectoscope was removed and had been taken out intermittently during the case in order to facilitate the  removal of the resected pieces.  Once the base was reached, and all resected polypoid mass was done, the cavity was then gently curetted to remove the pieces as well as good endometrial curetting.  SPECIMEN:  Endometrial resection with curettings sent to Pathology.  ESTIMATED BLOOD LOSS:  About 20 mL.  INTRAOPERATIVE FLUIDS:  500 mL.  URINE OUTPUT:  About 100 mL.  FLUID DEFICIT:  1500 mL of normal saline.  Sponge and instrument counts x2 was correct.  COMPLICATION:  None.  The patient tolerated procedure well, was transferred to recovery room in stable condition.     Maxie BetterSheronette Madisen Ludvigsen, M.D.     [place]/MED[facility]  D:  11/21/2013  T:  11/22/2013  Job:  [id number]

## 2013-11-27 NOTE — Brief Op Note (Signed)
11/21/2013  11:14 AM  PATIENT:  Jill HazelJuanita W Burton  68 y.o. female  PRE-OPERATIVE DIAGNOSIS:  Postmenopausal patient, Endometrial Mass  POST-OPERATIVE DIAGNOSIS:  Postmenopausal patient, Endometrial Mass  PROCEDURE:  Diagnostic hysteroscopy, hysteroscopic resection of endometrial polyps, dilation and curettage  SURGEON:  Surgeon(s) and Role:    * Tiandre Teall Cathie BeamsA Latonja Bobeck, MD - Primary  PHYSICIAN ASSISTANT:   ASSISTANTS: none   ANESTHESIA:   general  EBL:     BLOOD ADMINISTERED:none  DRAINS: none   LOCAL MEDICATIONS USED:  NONE  SPECIMEN:  Source of Specimen:  emc with endometrial resection  DISPOSITION OF SPECIMEN:  PATHOLOGY  COUNTS:  YES  TOURNIQUET:  * No tourniquets in log *  DICTATION: .Other Dictation: Dictation Number [id number]  PLAN OF CARE: Discharge to home after PACU  PATIENT DISPOSITION:  PACU - hemodynamically stable.   Delay start of Pharmacological VTE agent (>24hrs) due to surgical blood loss or risk of bleeding: no

## 2013-12-25 ENCOUNTER — Encounter: Payer: Medicare HMO | Admitting: *Deleted

## 2013-12-26 ENCOUNTER — Other Ambulatory Visit: Payer: Self-pay | Admitting: Family Medicine

## 2013-12-26 ENCOUNTER — Telehealth: Payer: Self-pay | Admitting: Internal Medicine

## 2013-12-26 DIAGNOSIS — R635 Abnormal weight gain: Secondary | ICD-10-CM

## 2013-12-26 NOTE — Telephone Encounter (Signed)
Order placed in the system. 

## 2013-12-26 NOTE — Telephone Encounter (Signed)
Pt had a referral to [facility]. Pt stated she is not a dm and her ins will not pay for her to go to [facility]. Pt would like a referral to a nutrition

## 2013-12-30 ENCOUNTER — Telehealth: Payer: Self-pay | Admitting: Internal Medicine

## 2013-12-30 NOTE — Telephone Encounter (Signed)
Patient called last week.  The order has been placed in the system.  See other note.

## 2013-12-30 NOTE — Telephone Encounter (Signed)
Pt is needing to get another referral for a nutritionist, pt states when she went for her appointment with [facility] , they informed her that her insurance would not cover it because she is not a diabetic. States her and dr. Fabian Sharppanosh spoke about seeing a nutritionist to avoid getting diabetes .

## 2014-03-12 ENCOUNTER — Encounter: Payer: Medicare HMO | Admitting: Internal Medicine

## 2014-03-12 ENCOUNTER — Encounter: Payer: Self-pay | Admitting: Internal Medicine

## 2014-03-12 DIAGNOSIS — Z0289 Encounter for other administrative examinations: Secondary | ICD-10-CM

## 2014-03-12 NOTE — Progress Notes (Signed)
Document opened and reviewed for  SDA  but appt  canceled same day .

## 2014-03-13 ENCOUNTER — Telehealth: Payer: Self-pay | Admitting: Internal Medicine

## 2014-03-13 ENCOUNTER — Other Ambulatory Visit: Payer: Self-pay | Admitting: Internal Medicine

## 2014-03-13 NOTE — Telephone Encounter (Signed)
Pt is wanting to know when she had her last test for thyroids and what the results are. Pt states she is having trouble sleeping at night and not sure if it is because of her thyroid.

## 2014-03-14 NOTE — Telephone Encounter (Signed)
Patient notified that her last TSH was normal.  She complains that she continues to have joint ache and pains.  Would like something sent to the pharmacy for arthritis.  Also having trouble sleeping at night.  Advised that she will need to come in for an appt.  She will check her schedule and make an appt.  Please advise.  Thanks!

## 2014-03-14 NOTE — Telephone Encounter (Signed)
This is not on her active med list   Needs OV to assess need for refill.

## 2014-03-21 ENCOUNTER — Encounter: Payer: Self-pay | Admitting: Internal Medicine

## 2014-03-21 ENCOUNTER — Ambulatory Visit (INDEPENDENT_AMBULATORY_CARE_PROVIDER_SITE_OTHER): Payer: Commercial Managed Care - HMO | Admitting: Internal Medicine

## 2014-03-21 VITALS — BP 154/74 | Temp 98.9°F | Ht 62.5 in | Wt 233.0 lb

## 2014-03-21 DIAGNOSIS — Z91199 Patient's noncompliance with other medical treatment and regimen due to unspecified reason: Secondary | ICD-10-CM

## 2014-03-21 DIAGNOSIS — E669 Obesity, unspecified: Secondary | ICD-10-CM

## 2014-03-21 DIAGNOSIS — I10 Essential (primary) hypertension: Secondary | ICD-10-CM

## 2014-03-21 DIAGNOSIS — M7918 Myalgia, other site: Secondary | ICD-10-CM

## 2014-03-21 DIAGNOSIS — IMO0001 Reserved for inherently not codable concepts without codable children: Secondary | ICD-10-CM

## 2014-03-21 DIAGNOSIS — Z5189 Encounter for other specified aftercare: Secondary | ICD-10-CM

## 2014-03-21 DIAGNOSIS — Z9119 Patient's noncompliance with other medical treatment and regimen: Secondary | ICD-10-CM

## 2014-03-21 DIAGNOSIS — R635 Abnormal weight gain: Secondary | ICD-10-CM

## 2014-03-21 DIAGNOSIS — J45909 Unspecified asthma, uncomplicated: Secondary | ICD-10-CM

## 2014-03-21 DIAGNOSIS — G479 Sleep disorder, unspecified: Secondary | ICD-10-CM

## 2014-03-21 DIAGNOSIS — E079 Disorder of thyroid, unspecified: Secondary | ICD-10-CM

## 2014-03-21 DIAGNOSIS — R7309 Other abnormal glucose: Secondary | ICD-10-CM

## 2014-03-21 MED ORDER — MONTELUKAST SODIUM 10 MG PO TABS: 10.0000 mg | ORAL_TABLET | Freq: Every day | ORAL | Status: DC

## 2014-03-21 NOTE — Progress Notes (Signed)
Chief Complaint  Patient presents with  . Follow-up    multiple issues    HPI: Jill Burton  comes in today for follow up of  multiple medical concerns.  Joints.  pains down arms    Taking aleve  For a while until wears.  Off went to dr V for hips and they found an abd endometrial massremoved  That needed removal. But then arms upper hurt a liot and asks about what to take for pain. She also wonders if she has some kind of infection such as a yeast infection is causing her musculoskeletal pain she denies any fever. ? Infection  With yeast.itchy ears.   Homeopathy  In past  Has seen for itchy ears  [facility]. Did not offer any pain medicine and she was disappointed with that.  Singulair   helps  With breathing . And her asthma has been better said she has been on Advair followed by Dr. Willa Rough but she hasn't seen her in a while she only has a few pills left.  Sleeps interupted a lot  Not that tired althouth  ROS: See pertinent positives and negatives per HPI. No fever but has had weight gain over the last number of months she only takes her blood pressure medicine "as needed" which includes the diuretic and the amlodipine. She doesn't check her blood pressure readings as frequently recently. Dates that she tends to retain fluid but only takes the fluid pill as needed.  Past Medical History  Diagnosis Date  . Allergic rhinitis     allergy testing neg in 2011  . Asthma   . SVD (spontaneous vaginal delivery)     x 1  . Anemia   . Hyperlipidemia     no meds  . Hypertension     does not take bp meds on a regular basis   Past Surgical History  Procedure Laterality Date  . Cesarean section      x3  . Tubal ligation    . Hernia repair      as child  . Dilitation & currettage/hystroscopy with versapoint resection N/A 11/21/2013    Procedure: DILATATION & CURETTAGE/HYSTEROSCOPY WITH VERSAPOINT RESECTION;  Surgeon: Serita Kyle, MD;  Location: [facility];  Service: Gynecology;   Laterality: N/A;    Family History  Problem Relation Age of Onset  . Asthma      History   Social History  . Marital Status: Married    Spouse Name: N/A    Number of Children: N/A  . Years of Education: N/A   Social History Main Topics  . Smoking status: Never Smoker   . Smokeless tobacco: Never Used  . Alcohol Use: No  . Drug Use: No  . Sexual Activity: Yes    Birth Control/ Protection: Surgical   Other Topics Concern  . 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    Outpatient Encounter Prescriptions as of 03/21/2014  Medication Sig  . albuterol (PROVENTIL) (2.5 MG/3ML) 0.083% nebulizer solution Take 2.5 mg by nebulization every 6 (six) hours as needed for wheezing or shortness of breath.  Marland Kitchen amLODipine (NORVASC) 5 MG tablet Take 5 mg by mouth as needed (patient reports she takes as needed for blood pressure).   . ferrous sulfate 325 (65 FE) MG EC tablet Take 325 mg by mouth daily.  . Fluticasone-Salmeterol (ADVAIR DISKUS)  250-50 MCG/DOSE AEPB as needed  . hydrochlorothiazide (HYDRODIURIL) 25 MG tablet as needed  . montelukast (SINGULAIR) 10 MG tablet Take 1 tablet (10 mg total) by mouth at bedtime.  . Multiple Vitamin (MULTIVITAMIN) tablet Take 1 tablet by mouth every other day.  Marland Kitchen PROAIR HFA 108 (90 BASE) MCG/ACT inhaler INHALE 2 PUFFS EVERY 4 HOURS AS NEEDED  . [DISCONTINUED] montelukast (SINGULAIR) 10 MG tablet Take 10 mg by mouth at bedtime.  . [DISCONTINUED] Fluticasone-Salmeterol (ADVAIR) 100-50 MCG/DOSE AEPB Inhale 1 puff into the lungs as needed.  . [DISCONTINUED] ibuprofen (ADVIL,MOTRIN) 800 MG tablet Take 1 tablet (800 mg total) by mouth every 8 (eight) hours as needed.  . [DISCONTINUED] vitamin B-12 (CYANOCOBALAMIN) 100 MCG tablet Take 100 mcg by mouth every other day.    EXAM:  BP 154/74  Temp(Src) 98.9 F (37.2 C) (Oral)  Ht 5' 2.5" (1.588 m)  Wt 233 lb (105.688 kg)   BMI 41.91 kg/m2  Body mass index is 41.91 kg/(m^2).  GENERAL: vitals reviewed and listed above, alert, oriented, appears well hydrated and in no acute distress moter active fidgety  ocass wheezy cough  HEENT: atraumatic, conjunctiva  clear, no obvious abnormalities on inspection of external nose and ears  tms clear eacs somewhat flaky irritated OP : no lesion edema or exudate  NECK: no obvious masses on inspection palpation  LUNGS:  ocass rare wheeze right base  No rales bs = CV: HRRR, no clubbing cyanosis or  peripheral edema nl cap refill  MS: moves all extremities without noticeable focal  Abnormality hold onto upper arms as area of concern  PSYCH: pleasant and cooperative, no obvious depression or anxiety very fidgitey Lab Results  Component Value Date   WBC 9.1 11/19/2013   HGB 13.9 11/19/2013   HCT 42.7 11/19/2013   PLT 224 11/19/2013   GLUCOSE 107* 11/19/2013   CHOL 234* 09/23/2013   TRIG 147.0 09/23/2013   HDL 55.60 09/23/2013   LDLDIRECT 157.4 09/23/2013   ALT 20 09/23/2013   AST 20 09/23/2013   NA 142 11/19/2013   K 4.5 11/19/2013   CL 104 11/19/2013   CREATININE 0.76 11/19/2013   BUN 12 11/19/2013   CO2 28 11/19/2013   TSH 0.83 09/23/2013   HGBA1C 6.3 09/23/2013    ASSESSMENT AND PLAN:  Discussed the following assessment and plan:  Unspecified asthma(493.90) - apparently some control almost out of singulair refill but fu dr Willa Rough   Obesity - Plan: TSH, T4, free, T3, free, Hemoglobin A1c, Basic metabolic panel  Unspecified essential hypertension - Doesn't take medicine regularly despite recurrent education help this have her try the diuretic once a day may be more amenable to her because of "fluid retenti - Plan: TSH, T4, free, T3, free, Hemoglobin A1c, Basic metabolic panel  THYROID STIMULATING HORMONE, ABNORMAL - remote hx but nl  12 14  follow repeat when convenient  - Plan: TSH, T4, free, T3, free, Hemoglobin A1c, Basic metabolic panel  Weight gain - Plan: TSH, T4, free, T3, free,  Hemoglobin A1c, Basic metabolic panel  Musculoskeletal pain - arms then hips  neg labs 12 14 ? if djd ?other not from yeast infection - Plan: TSH, T4, free, T3, free, Hemoglobin A1c, Basic metabolic panel  Alternative medicine - Plan: TSH, T4, free, T3, free, Hemoglobin A1c, Basic metabolic panel  Sleep difficulties - consider apnea but very motor active day ? other problem dose snore per husband tired  - Plan: TSH, T4, free, T3, free, Hemoglobin A1c, Basic  metabolic panel  HYPERGLYCEMIA - Plan: TSH, T4, free, T3, free, Hemoglobin A1c, Basic metabolic panel  Current non-adherence to medical treatment - bp meds see not  declines immuniz  Declines immunizations uses alternative  Plans on   Cleansing diet?    Risk benefit of medication nsaid  Discussed.some risk but can use ocass  Need to take BP med every day  ( uncertain if she will comply)   -Patient advised to return or notify health care team  if symptoms worsen ,persist or new concerns arise.  Patient Instructions   Refill the Singulair today but make sure you have a follow up with dr Willa RoughHicks. I don't think you have an infection causing the pain.  Your last labs showed no inflammation but we may want to recheck your thyroid and blood sugar test.     Contact us for appt time otherwise.   Check BP readings  As  You should be below  140/90  I advise taking  BP medication EVERY DAY to get  Ir under control.   Try the diuretic every day for 1-2 months .   Strongly consider letting us do a pulmonary referral to sleep specialist for possible sleep apnea.  Poor sleep can make pain worse.  Let me review dr  Charlett BlakeVoytek  Note   Still consider   Seeing rheumatologist but uncertain what intervention would help.    Neta MendsWanda K. Nhia Heaphy M.D.  Pre visit review using our clinic review tool, if applicable. No additional management support is needed unless otherwise documented below in the visit note.

## 2014-03-21 NOTE — Patient Instructions (Addendum)
Refill the Singulair today but make sure you have a follow up with dr Willa Rough. I don't think you have an infection causing the pain.  Your last labs showed no inflammation but we may want to recheck your thyroid and blood sugar test.     Contact us for appt time otherwise.   Check BP readings  As  You should be below  140/90  I advise taking  BP medication EVERY DAY to get  Ir under control.   Try the diuretic every day for 1-2 months .   Strongly consider letting us do a pulmonary referral to sleep specialist for possible sleep apnea.  Poor sleep can make pain worse.  Let me review dr  Charlett Blake  Note   Still consider   Seeing rheumatologist but uncertain what intervention would help.

## 2014-11-17 ENCOUNTER — Telehealth: Payer: Self-pay | Admitting: Internal Medicine

## 2014-11-17 MED ORDER — DICLOFENAC SODIUM 1 % TD GEL: 4.0000 g | Freq: Four times a day (QID) | TRANSDERMAL | Status: DC

## 2014-11-17 NOTE — Telephone Encounter (Signed)
Pt thinks she may have arthritis in knee and no appts this week.  Pt is hoping dr Fabian Sharppanosh will call her something in [facility] the [facility] [address] [address] This has been going on >2 weeks. pls advise

## 2014-11-17 NOTE — Telephone Encounter (Signed)
Spoke to the pt.  She complains of pain in her left knee that has been coming and going for the past few weeks.  Denies any bruising, redness or fever.  Per [facility], okay to try voltaren gel qid.  Sent to the pharmacy by e-scribe.  Pt notified that if this does not work than she will need to come in and be seen.

## 2014-11-25 DIAGNOSIS — M25562 Pain in left knee: Secondary | ICD-10-CM | POA: Diagnosis not present

## 2015-02-20 ENCOUNTER — Telehealth: Payer: Self-pay | Admitting: Internal Medicine

## 2015-02-20 DIAGNOSIS — R739 Hyperglycemia, unspecified: Secondary | ICD-10-CM

## 2015-02-20 DIAGNOSIS — E669 Obesity, unspecified: Secondary | ICD-10-CM

## 2015-02-20 DIAGNOSIS — J309 Allergic rhinitis, unspecified: Secondary | ICD-10-CM | POA: Diagnosis not present

## 2015-02-20 NOTE — Telephone Encounter (Signed)
Pt would like to be referred to julie duffy-dillon [phone]. Pt would like to be seen for weight loss and  How to eat healthy. Pt does not want to go to [facility] due to she does not have diabetes.. Pt has [employer]

## 2015-02-20 NOTE — Telephone Encounter (Signed)
LM for the pt to return my call. 

## 2015-02-23 NOTE — Telephone Encounter (Signed)
LM for the pt to return my call. 

## 2015-02-24 NOTE — Telephone Encounter (Signed)
LM for the pt to return my call. 

## 2015-02-25 NOTE — Telephone Encounter (Signed)
Referral placed in the system. 

## 2015-03-18 DIAGNOSIS — J4540 Moderate persistent asthma, uncomplicated: Secondary | ICD-10-CM | POA: Diagnosis not present

## 2015-03-18 DIAGNOSIS — J309 Allergic rhinitis, unspecified: Secondary | ICD-10-CM | POA: Diagnosis not present

## 2015-04-10 DIAGNOSIS — E559 Vitamin D deficiency, unspecified: Secondary | ICD-10-CM | POA: Diagnosis not present

## 2015-04-10 DIAGNOSIS — R5383 Other fatigue: Secondary | ICD-10-CM | POA: Diagnosis not present

## 2015-04-10 DIAGNOSIS — E119 Type 2 diabetes mellitus without complications: Secondary | ICD-10-CM | POA: Diagnosis not present

## 2015-04-10 DIAGNOSIS — J45998 Other asthma: Secondary | ICD-10-CM | POA: Diagnosis not present

## 2015-04-10 DIAGNOSIS — E039 Hypothyroidism, unspecified: Secondary | ICD-10-CM | POA: Diagnosis not present

## 2015-05-26 ENCOUNTER — Telehealth: Payer: Self-pay | Admitting: Internal Medicine

## 2015-05-26 NOTE — Telephone Encounter (Signed)
Dr. Fabian Sharp is not here so I cannot get authorization from her.  Pt will need to get an order from Dr. Alessandra Bevels and bring with her.

## 2015-05-26 NOTE — Telephone Encounter (Signed)
Received orders from dr vaughn's office and will give to [facility]

## 2015-05-26 NOTE — Telephone Encounter (Signed)
Pt had [facility] done at [facility], and some of the lab Dr Allena Napoleon is requesting to be rechecked. since pt having [facility] labs tomorrow anyway Pt would like to know if she can add: 1.  a1C 2. Advanced lipid panel 3. Fasting insulin 4. Homocysteine

## 2015-05-26 NOTE — Telephone Encounter (Signed)
Pt aware.  Called dr Alessandra Bevels office to fax order to our office.

## 2015-05-27 ENCOUNTER — Other Ambulatory Visit (INDEPENDENT_AMBULATORY_CARE_PROVIDER_SITE_OTHER): Payer: Commercial Managed Care - HMO

## 2015-05-27 DIAGNOSIS — R799 Abnormal finding of blood chemistry, unspecified: Secondary | ICD-10-CM | POA: Diagnosis not present

## 2015-05-27 DIAGNOSIS — E708 Other disorders of aromatic amino-acid metabolism: Secondary | ICD-10-CM | POA: Diagnosis not present

## 2015-05-27 DIAGNOSIS — Z0000 Encounter for general adult medical examination without abnormal findings: Secondary | ICD-10-CM

## 2015-05-27 LAB — CBC WITH DIFFERENTIAL/PLATELET
Basophils Absolute: 0 10*3/uL (ref 0.0–0.1)
Basophils Relative: 0.7 % (ref 0.0–3.0)
Eosinophils Absolute: 0.1 10*3/uL (ref 0.0–0.7)
Eosinophils Relative: 1.6 % (ref 0.0–5.0)
HCT: 43.5 % (ref 36.0–46.0)
Hemoglobin: 14.3 g/dL (ref 12.0–15.0)
Lymphocytes Relative: 32.5 % (ref 12.0–46.0)
Lymphs Abs: 2.1 10*3/uL (ref 0.7–4.0)
MCHC: 32.9 g/dL (ref 30.0–36.0)
MCV: 87.9 fl (ref 78.0–100.0)
Monocytes Absolute: 0.4 10*3/uL (ref 0.1–1.0)
Monocytes Relative: 6 % (ref 3.0–12.0)
Neutro Abs: 3.8 10*3/uL (ref 1.4–7.7)
Neutrophils Relative %: 59.2 % (ref 43.0–77.0)
Platelets: 219 10*3/uL (ref 150.0–400.0)
RBC: 4.95 Mil/uL (ref 3.87–5.11)
RDW: 14.3 % (ref 11.5–15.5)
WBC: 6.4 10*3/uL (ref 4.0–10.5)

## 2015-05-27 LAB — LIPID PANEL
Cholesterol: 221 mg/dL — ABNORMAL HIGH (ref 0–200)
HDL: 62.4 mg/dL (ref >39.00)
LDL Cholesterol: 143 mg/dL — ABNORMAL HIGH (ref 0–99)
NonHDL: 158.99
Total CHOL/HDL Ratio: 4
Triglycerides: 78 mg/dL (ref 0.0–149.0)
VLDL: 15.6 mg/dL (ref 0.0–40.0)

## 2015-05-27 LAB — HEPATIC FUNCTION PANEL
ALT: 14 U/L (ref 0–35)
AST: 15 U/L (ref 0–37)
Albumin: 4.3 g/dL (ref 3.5–5.2)
Alkaline Phosphatase: 62 U/L (ref 39–117)
Bilirubin, Direct: 0.1 mg/dL (ref 0.0–0.3)
Total Bilirubin: 0.5 mg/dL (ref 0.2–1.2)
Total Protein: 6.8 g/dL (ref 6.0–8.3)

## 2015-05-27 LAB — BASIC METABOLIC PANEL
BUN: 14 mg/dL (ref 6–23)
CO2: 26 mEq/L (ref 19–32)
Calcium: 9.3 mg/dL (ref 8.4–10.5)
Chloride: 107 mEq/L (ref 96–112)
Creatinine, Ser: 0.82 mg/dL (ref 0.40–1.20)
GFR: 89.16 mL/min (ref >60.00)
Glucose, Bld: 104 mg/dL — ABNORMAL HIGH (ref 70–99)
Potassium: 4 mEq/L (ref 3.5–5.1)
Sodium: 141 mEq/L (ref 135–145)

## 2015-05-27 LAB — TSH: TSH: 1 u[IU]/mL (ref 0.35–4.50)

## 2015-05-27 LAB — HEMOGLOBIN A1C: Hgb A1c MFr Bld: 6 % (ref 4.6–6.5)

## 2015-05-27 LAB — HIGH SENSITIVITY CRP: CRP, High Sensitivity: 1.82 mg/L (ref 0.000–5.000)

## 2015-05-27 NOTE — Telephone Encounter (Signed)
Olegario Messier, please document what happened with those orders.  Thanks!

## 2015-05-27 NOTE — Telephone Encounter (Signed)
Gave the lab order to [facility] to confirm we could do these labs.  Labs were verified and Laney Potash stated he would put the order in. Misty notified this order was handled.

## 2015-05-28 LAB — HOMOCYSTEINE: Homocysteine: 5.8 umol/L (ref <10.4)

## 2015-05-28 LAB — INSULIN, FASTING: Insulin fasting, serum: 8.7 u[IU]/mL (ref 2.0–19.6)

## 2015-05-31 LAB — CARDIO IQ(R) ADVANCED LIPID PANEL
Apolipoprotein B: 100 mg/dL (ref 49–103)
Cholesterol, Total: 231 mg/dL — ABNORMAL HIGH (ref 125–200)
Cholesterol/HDL Ratio: 3.3 calc (ref <=5.0)
HDL Cholesterol: 71 mg/dL (ref >=46)
LDL Large: 7725 nmol/L (ref 5038–17886)
LDL Medium: 325 nmol/L (ref 121–397)
LDL Particle Number: 1603 nmol/L (ref 1016–2185)
LDL Peak Size: 222.3 Angstrom (ref >=218.2)
LDL Small: 178 nmol/L (ref 115–386)
LDL, Calculated: 143 mg/dL — ABNORMAL HIGH
Lipoprotein (a): 94 nmol/L — ABNORMAL HIGH (ref <75)
Non-HDL Cholesterol: 160 mg/dL
Triglycerides: 83 mg/dL

## 2015-06-01 NOTE — Telephone Encounter (Signed)
Faxed results to Dr. Martyn Ehrich office.  Received confirmation that the fax was successful.

## 2015-06-03 ENCOUNTER — Encounter: Payer: Self-pay | Admitting: Internal Medicine

## 2015-06-03 ENCOUNTER — Ambulatory Visit (INDEPENDENT_AMBULATORY_CARE_PROVIDER_SITE_OTHER): Payer: Commercial Managed Care - HMO | Admitting: Internal Medicine

## 2015-06-03 VITALS — BP 138/78 | Temp 98.2°F | Ht 61.75 in | Wt 227.0 lb

## 2015-06-03 DIAGNOSIS — Z0000 Encounter for general adult medical examination without abnormal findings: Secondary | ICD-10-CM

## 2015-06-03 DIAGNOSIS — Z2821 Immunization not carried out because of patient refusal: Secondary | ICD-10-CM

## 2015-06-03 DIAGNOSIS — E669 Obesity, unspecified: Secondary | ICD-10-CM | POA: Diagnosis not present

## 2015-06-03 DIAGNOSIS — R739 Hyperglycemia, unspecified: Secondary | ICD-10-CM

## 2015-06-03 DIAGNOSIS — Z5189 Encounter for other specified aftercare: Secondary | ICD-10-CM | POA: Diagnosis not present

## 2015-06-03 DIAGNOSIS — I10 Essential (primary) hypertension: Secondary | ICD-10-CM

## 2015-06-03 DIAGNOSIS — Z1211 Encounter for screening for malignant neoplasm of colon: Secondary | ICD-10-CM

## 2015-06-03 NOTE — Progress Notes (Signed)
Pre visit review using our clinic review tool, if applicable. No additional management support is needed unless otherwise documented below in the visit note.  Chief Complaint  Patient presents with  . Medicare Wellness    HPI: Jill Burton 69 y.o. comes in today for Preventive Medicare wellness visit .  since last visit. She has seen Dr. Willa Rough her asthma doctor who gave her Asmanex that she states made her sick. States that her chest got worse and cause some kind of infection she was then given azithromycin to take every day for 3-4 weeks and it is what she says totally help her asthma and wheezing. She is to follow-up with her doctor in 2 weeks. Hasn't used pro-air rescue for about a month. t    Went to nutritionist. But was concerned cost  $124. Sees    Dr Roylene Reason    Pay for that.  Twice a year Dr. Alessandra Bevels wanted extra lab tests done including insulin levels and particle sizes on her lipids. Patient felt she should have blood drawn all at the same time. She does take supplements DH EEA was on B vitamins that helped her sleep but only on melatonin now. She is not taking blood pressure medicine. Hasn't done this for a while according to her.  Health Maintenance  Topic Date Due  . Hepatitis C Screening  [birth year]  . MAMMOGRAM  05/27/1997  . COLONOSCOPY  05/27/1997  . DEXA SCAN  05/27/2012  . INFLUENZA VACCINE  05/18/2015  . ZOSTAVAX  06/02/2016 (Originally 05/28/2007)  . TETANUS/TDAP  06/02/2016 (Originally 05/27/1966)  . PNA vac Low Risk Adult (1 of 2 - PCV13) 06/02/2016 (Originally 05/27/2012)   Health Maintenance Review LIFESTYLE:  Exercise:   Active  No formal exercise  Tobacco/ETS:no Alcohol: no Sugar beverages: no Sleep: was on b12 and  complex shots   Melatonin.Marland Kitchen   MEDICARE DOCUMENT QUESTIONS  TO SCAN   Hearing: ok   Vision:  No limitations at present . Last eye check UTD eye exam oin 1-2 weeks   Safety:  Has smoke detector and wears seat belts.  No firearms. No  excess sun exposure. Sees dentist regularly.  Falls: no  Advance directive :  Reviewed  . Doesn't have one  Memory: Felt to be good  , no concern from her or her family.  Depression: No anhedonia unusual crying or depressive symptoms  Nutrition: Eats well balanced diet; adequate calcium and vitamin D. No swallowing chewing problems.  Injury: no major injuries in the last six months.  Other healthcare providers:  Reviewed today .  Social:  Lives with spouse married. No pets.   Preventive parameters: up-to-date  Reviewed   ADLS:   There are no problems or need for assistance  driving, feeding, obtaining food, dressing, toileting and bathing, managing money using phone. She is independent.    ROS:  GEN/ HEENT: No fever, significant weight changes sweats headaches vision problems hearing changes, CV/ PULM; No chest pain shortness of breath cough, syncope,edema  change in exercise tolerance. Gets occasional flutter palpitation without associated symptoms or racing heart. GI /GU: No adominal pain, vomiting, change in bowel habits. No blood in the stool. No significant GU symptoms. SKIN/HEME: ,no acute skin rashes suspicious lesions or bleeding. No lymphadenopathy, nodules, masses.  NEURO/ PSYCH:  No neurologic signs such as weakness numbness. No depression anxiety. IMM/ Allergy: No unusual infections.  Allergy .   REST of 12 system review negative except as per HPI   Past Medical  History  Diagnosis Date  . Allergic rhinitis     allergy testing neg in 2011  . Asthma   . SVD (spontaneous vaginal delivery)     x 1  . Anemia   . Hyperlipidemia     no meds  . Hypertension     does not take bp meds on a regular basis    Family History  Problem Relation Age of Onset  . Asthma      Social History   Social History  . Marital Status: Married    Spouse Name: N/A  . Number of Children: N/A  . Years of Education: N/A   Social History Main Topics  . Smoking status: Never  Smoker   . Smokeless tobacco: Never Used  . Alcohol Use: No  . Drug Use: No  . Sexual Activity: Yes    Birth Control/ Protection: Surgical   Other Topics Concern  . 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      hh of 2     Outpatient Encounter Prescriptions as of 06/03/2015  Medication Sig  . albuterol (PROVENTIL) (2.5 MG/3ML) 0.083% nebulizer solution Take 2.5 mg by nebulization every 6 (six) hours as needed for wheezing or shortness of breath.  . hydrochlorothiazide (HYDRODIURIL) 25 MG tablet as needed  . montelukast (SINGULAIR) 10 MG tablet Take 1 tablet (10 mg total) by mouth at bedtime.  . Multiple Vitamin (MULTIVITAMIN) tablet Take 1 tablet by mouth every other day.  Marland Kitchen PROAIR HFA 108 (90 BASE) MCG/ACT inhaler INHALE 2 PUFFS EVERY 4 HOURS AS NEEDED  . amLODipine (NORVASC) 5 MG tablet Take 5 mg by mouth as needed (patient reports she takes as needed for blood pressure).   . diclofenac sodium (VOLTAREN) 1 % GEL Apply 4 g topically 4 (four) times daily. (Patient not taking: Reported on 06/03/2015)  . ferrous sulfate 325 (65 FE) MG EC tablet Take 325 mg by mouth daily.  . Fluticasone-Salmeterol (ADVAIR DISKUS) 250-50 MCG/DOSE AEPB as needed   No facility-administered encounter medications on file as of 06/03/2015.    EXAM:  BP 138/78 mmHg  Temp(Src) 98.2 F (36.8 C) (Oral)  Ht 5' 1.75" (1.568 m)  Wt 227 lb (102.967 kg)  BMI 41.88 kg/m2  Body mass index is 41.88 kg/(m^2).  Physical Exam: Vital signs reviewed ONG:EXBM is a well-developed well-nourished alert cooperative   who appears stated age in no acute distress.  HEENT: normocephalic atraumatic , Eyes: PERRL EOM's full, conjunctiva clear, Nares: paten,t no deformity discharge or tenderness., Ears: no deformity EAC's clear TMs with normal landmarks. Mouth: clear OP, no lesions, edema.  Moist mucous membranes. Dentition in adequate  repair. NECK: supple without masses, thyromegaly or bruits. CHEST/PULM:  Clear to auscultation and percussion breath sounds equal no wheeze , rales or rhonchi. ?No chest wall deformities or tenderness. CV: PMI is nondisplaced, S1 S2 no gallops, murmurs, rubs. Peripheral pulses are full without delay.No JVD . Breast: normal by inspection . No dimpling, discharge, masses, tenderness or discharge . ABDOMEN: Bowel sounds normal nontender  No guard or rebound, no hepato splenomegal no CVA tenderness.  No hernia. Extremtities:  No clubbing cyanosis or edema, no acute joint swelling or redness no focal atrophy NEURO:  Oriented x3, cranial nerves 3-12 appear to be intact, no obvious focal weakness,gait within normal limits no abnormal reflexes or asymmetrical SKIN: No  acute rashes normal turgor, color, no bruising or petechiae. PSYCH: Oriented, good eye contact, no obvious depression anxiety, cognition and judgment appear normal. LN: no cervical axillary inguinal adenopathy No noted deficits in memory, attention, and speech.   Lab Results  Component Value Date   WBC 6.4 05/27/2015   HGB 14.3 05/27/2015   HCT 43.5 05/27/2015   PLT 219.0 05/27/2015   GLUCOSE 104* 05/27/2015   CHOL 221* 05/27/2015   CHOL 231* 05/27/2015   TRIG 78.0 05/27/2015   TRIG 83 05/27/2015   HDL 62.40 05/27/2015   HDL 71 05/27/2015   LDLDIRECT 157.4 09/23/2013   LDLCALC 143* 05/27/2015   LDLCALC 143* 05/27/2015   ALT 14 05/27/2015   AST 15 05/27/2015   NA 141 05/27/2015   K 4.0 05/27/2015   CL 107 05/27/2015   CREATININE 0.82 05/27/2015   BUN 14 05/27/2015   CO2 26 05/27/2015   TSH 1.00 05/27/2015   HGBA1C 6.0 05/27/2015   BP Readings from Last 3 Encounters:  06/03/15 138/78  03/21/14 154/74  11/21/13 135/63   Wt Readings from Last 3 Encounters:  06/03/15 227 lb (102.967 kg)  03/21/14 233 lb (105.688 kg)  11/19/13 234 lb (106.142 kg)  EKG normal some PACS  No acute findings   ASSESSMENT AND  PLAN:  Discussed the following assessment and plan:  Visit for preventive health examination  Medicare annual wellness visit, subsequent - advance ddirective  ho given   Obesity  Hyperglycemia - better a1c in orediabetes range   Essential hypertension - ok to day not taking medication - Plan: EKG 12-Lead  Alternative medicine  Colon cancer screening - Plan: Fecal occult blood, imunochemical  Pneumococcal vaccination declined by patient Colon cancer immuniz update Patient is avoiding colonoscopy at this time. Did discuss stool cards I FOB if done yearly is a reasonable alternative. Order placed. Discussed lipid profile she's not interested in medication. Discussed use of lipoprotein particle sizes ordered by dr Alessandra Bevels . Doesn't really change  intervention at this time and course of advice. Reviewed appropriate diet nutrition and exercise. She continues to take supplements but does not want to take immunizations at this point is not taking any blood pressure medicines. She thinks her blood pressure is good at home. She takes  Supplements inc dhea for ? Reason Asthma lungs clear today   Usually is tight with wheezing.  Patient Care Team: Madelin Headings, MD as PCP - General Roselyn Kara Mead, MD as Attending Physician (Allergy) Mia Creek, MD as Referring Physician (Internal Medicine) Lunette Stands, MD as Consulting Physician (Orthopedic Surgery)  Patient Instructions     Consider vaccine  For Prevention for blood stream infection .  Of certain type of pneumonia germs    Healthy lifestyle includes : At least 150 minutes of exercise weeks  , weight at healthy levels, which is usually   BMI 19-25. Avoid trans fats and processed foods;  Increase fresh fruits and veges to 5 servings per day. And avoid sweet beverages including tea and juice. Mediterranean diet with olive oil and nuts have been noted to be heart and brain healthy . Avoid tobacco products . Limit  alcohol to  7 per  week for women and 14 servings for men.  Get adequate sleep . Wear seat belts . Don't text and drive .        Why follow it? Research shows. . Those who follow the Mediterranean diet have a reduced risk of heart disease  . The diet  is associated with a reduced incidence of Parkinson's and Alzheimer's diseases . People following the diet may have longer life expectancies and lower rates of chronic diseases  . The Dietary Guidelines for Americans recommends the Mediterranean diet as an eating plan to promote health and prevent disease  What Is the Mediterranean Diet?  . Healthy eating plan based on typical foods and recipes of Mediterranean-style cooking . The diet is primarily a plant based diet; these foods should make up a majority of meals   Starches - Plant based foods should make up a majority of meals - They are an important sources of vitamins, minerals, energy, antioxidants, and fiber - Choose whole grains, foods high in fiber and minimally processed items  - Typical grain sources include wheat, oats, barley, corn, brown rice, bulgar, farro, millet, polenta, couscous  - Various types of beans include chickpeas, lentils, fava beans, black beans, white beans   Fruits  Veggies - Large quantities of antioxidant rich fruits & veggies; 6 or more servings  - Vegetables can be eaten raw or lightly drizzled with oil and cooked  - Vegetables common to the traditional Mediterranean Diet include: artichokes, arugula, beets, broccoli, brussel sprouts, cabbage, carrots, celery, collard greens, cucumbers, eggplant, kale, leeks, lemons, lettuce, mushrooms, okra, onions, peas, peppers, potatoes, pumpkin, radishes, rutabaga, shallots, spinach, sweet potatoes, turnips, zucchini - Fruits common to the Mediterranean Diet include: apples, apricots, avocados, cherries, clementines, dates, figs, grapefruits, grapes, melons, nectarines, oranges, peaches, pears, pomegranates, strawberries, tangerines  Fats -  Replace butter and margarine with healthy oils, such as olive oil, canola oil, and tahini  - Limit nuts to no more than a handful a day  - Nuts include walnuts, almonds, pecans, pistachios, pine nuts  - Limit or avoid candied, honey roasted or heavily salted nuts - Olives are central to the [employer] - can be eaten whole or used in a variety of dishes   Meats Protein - Limiting red meat: no more than a few times a month - When eating red meat: choose lean cuts and keep the portion to the size of deck of cards - Eggs: approx. 0 to 4 times a week  - Fish and lean poultry: at least 2 a week  - Healthy protein sources include, chicken, [place], lean beef, lamb - Increase intake of seafood such as tuna, salmon, trout, mackerel, shrimp, scallops - Avoid or limit high fat processed meats such as sausage and bacon  Dairy - Include moderate amounts of low fat dairy products  - Focus on healthy dairy such as fat free yogurt, skim milk, low or reduced fat cheese - Limit dairy products higher in fat such as whole or 2% milk, cheese, ice cream  Alcohol - Moderate amounts of red wine is ok  - No more than 5 oz daily for women (all ages) and men older than age 1  - No more than 10 oz of wine daily for men younger than 66  Other - Limit sweets and other desserts  - Use herbs and spices instead of salt to flavor foods  - Herbs and spices common to the traditional Mediterranean Diet include: basil, bay leaves, chives, cloves, cumin, fennel, garlic, lavender, marjoram, mint, oregano, parsley, pepper, rosemary, sage, savory, sumac, tarragon, thyme   It's not just a diet, it's a lifestyle:  . The Mediterranean diet includes lifestyle factors typical of those in the region  . Foods, drinks and meals are best eaten with others and savored . Daily  physical activity is important for overall good health . This could be strenuous exercise like running and aerobics . This could also be more leisurely  activities such as walking, housework, yard-work, or taking the stairs . Moderation is the key; a balanced and healthy diet accommodates most foods and drinks . Consider portion sizes and frequency of consumption of certain foods   Meal Ideas & Options:  . Breakfast:  o Whole wheat toast or whole wheat English muffins with peanut butter & hard boiled egg o Steel cut oats topped with apples & cinnamon and skim milk  o Fresh fruit: banana, strawberries, melon, berries, peaches  o Smoothies: strawberries, bananas, greek yogurt, peanut butter o Low fat greek yogurt with blueberries and granola  o Egg white omelet with spinach and mushrooms o Breakfast couscous: whole wheat couscous, apricots, skim milk, cranberries  . Sandwiches:  o Hummus and grilled vegetables (peppers, zucchini, squash) on whole wheat bread   o Grilled chicken on whole wheat pita with lettuce, tomatoes, cucumbers or tzatziki  o Tuna salad on whole wheat bread: tuna salad made with greek yogurt, olives, red peppers, capers, green onions o Garlic rosemary lamb pita: lamb sauted with garlic, rosemary, salt & pepper; add lettuce, cucumber, greek yogurt to pita - flavor with lemon juice and black pepper  . Seafood:  o Mediterranean grilled salmon, seasoned with garlic, basil, parsley, lemon juice and black pepper o Shrimp, lemon, and spinach whole-grain pasta salad made with low fat greek yogurt  o Seared scallops with lemon orzo  o Seared tuna steaks seasoned salt, pepper, coriander topped with tomato mixture of olives, tomatoes, olive oil, minced garlic, parsley, green onions and cappers  . Meats:  o Herbed greek chicken salad with kalamata olives, cucumber, feta  o Red bell peppers stuffed with spinach, bulgur, lean ground beef (or lentils) & topped with feta   o Kebabs: skewers of chicken, tomatoes, onions, zucchini, squash  o [place] burgers: made with red onions, mint, dill, lemon juice, feta cheese topped with roasted red  peppers . Vegetarian o Cucumber salad: cucumbers, artichoke hearts, celery, red onion, feta cheese, tossed in olive oil & lemon juice  o Hummus and whole grain pita points with a greek salad (lettuce, tomato, feta, olives, cucumbers, red onion) o Lentil soup with celery, carrots made with vegetable broth, garlic, salt and pepper  o Tabouli salad: parsley, bulgur, mint, scallions, cucumbers, tomato, radishes, lemon juice, olive oil, salt and pepper.           Neta Mends. Cherell Colvin M.D.

## 2015-06-03 NOTE — Patient Instructions (Addendum)
Consider vaccine  For Prevention for blood stream infection .  Of certain type of pneumonia germs    Healthy lifestyle includes : At least 150 minutes of exercise weeks  , weight at healthy levels, which is usually   BMI 19-25. Avoid trans fats and processed foods;  Increase fresh fruits and veges to 5 servings per day. And avoid sweet beverages including tea and juice. Mediterranean diet with olive oil and nuts have been noted to be heart and brain healthy . Avoid tobacco products . Limit  alcohol to  7 per week for women and 14 servings for men.  Get adequate sleep . Wear seat belts . Don't text and drive .        Why follow it? Research shows. . Those who follow the Mediterranean diet have a reduced risk of heart disease  . The diet is associated with a reduced incidence of Parkinson's and Alzheimer's diseases . People following the diet may have longer life expectancies and lower rates of chronic diseases  . The Dietary Guidelines for Americans recommends the Mediterranean diet as an eating plan to promote health and prevent disease  What Is the Mediterranean Diet?  . Healthy eating plan based on typical foods and recipes of Mediterranean-style cooking . The diet is primarily a plant based diet; these foods should make up a majority of meals   Starches - Plant based foods should make up a majority of meals - They are an important sources of vitamins, minerals, energy, antioxidants, and fiber - Choose whole grains, foods high in fiber and minimally processed items  - Typical grain sources include wheat, oats, barley, corn, brown rice, bulgar, farro, millet, polenta, couscous  - Various types of beans include chickpeas, lentils, fava beans, black beans, white beans   Fruits  Veggies - Large quantities of antioxidant rich fruits & veggies; 6 or more servings  - Vegetables can be eaten raw or lightly drizzled with oil and cooked  - Vegetables common to the traditional Mediterranean  Diet include: artichokes, arugula, beets, broccoli, brussel sprouts, cabbage, carrots, celery, collard greens, cucumbers, eggplant, kale, leeks, lemons, lettuce, mushrooms, okra, onions, peas, peppers, potatoes, pumpkin, radishes, rutabaga, shallots, spinach, sweet potatoes, turnips, zucchini - Fruits common to the Mediterranean Diet include: apples, apricots, avocados, cherries, clementines, dates, figs, grapefruits, grapes, melons, nectarines, oranges, peaches, pears, pomegranates, strawberries, tangerines  Fats - Replace butter and margarine with healthy oils, such as olive oil, canola oil, and tahini  - Limit nuts to no more than a handful a day  - Nuts include walnuts, almonds, pecans, pistachios, pine nuts  - Limit or avoid candied, honey roasted or heavily salted nuts - Olives are central to the [employer] - can be eaten whole or used in a variety of dishes   Meats Protein - Limiting red meat: no more than a few times a month - When eating red meat: choose lean cuts and keep the portion to the size of deck of cards - Eggs: approx. 0 to 4 times a week  - Fish and lean poultry: at least 2 a week  - Healthy protein sources include, chicken, [place], lean beef, lamb - Increase intake of seafood such as tuna, salmon, trout, mackerel, shrimp, scallops - Avoid or limit high fat processed meats such as sausage and bacon  Dairy - Include moderate amounts of low fat dairy products  - Focus on healthy dairy such as fat free yogurt, skim milk, low or reduced fat cheese -  Limit dairy products higher in fat such as whole or 2% milk, cheese, ice cream  Alcohol - Moderate amounts of red wine is ok  - No more than 5 oz daily for women (all ages) and men older than age 61  - No more than 10 oz of wine daily for men younger than 21  Other - Limit sweets and other desserts  - Use herbs and spices instead of salt to flavor foods  - Herbs and spices common to the traditional Mediterranean Diet include:  basil, bay leaves, chives, cloves, cumin, fennel, garlic, lavender, marjoram, mint, oregano, parsley, pepper, rosemary, sage, savory, sumac, tarragon, thyme   It's not just a diet, it's a lifestyle:  . The Mediterranean diet includes lifestyle factors typical of those in the region  . Foods, drinks and meals are best eaten with others and savored . Daily physical activity is important for overall good health . This could be strenuous exercise like running and aerobics . This could also be more leisurely activities such as walking, housework, yard-work, or taking the stairs . Moderation is the key; a balanced and healthy diet accommodates most foods and drinks . Consider portion sizes and frequency of consumption of certain foods   Meal Ideas & Options:  . Breakfast:  o Whole wheat toast or whole wheat English muffins with peanut butter & hard boiled egg o Steel cut oats topped with apples & cinnamon and skim milk  o Fresh fruit: banana, strawberries, melon, berries, peaches  o Smoothies: strawberries, bananas, greek yogurt, peanut butter o Low fat greek yogurt with blueberries and granola  o Egg white omelet with spinach and mushrooms o Breakfast couscous: whole wheat couscous, apricots, skim milk, cranberries  . Sandwiches:  o Hummus and grilled vegetables (peppers, zucchini, squash) on whole wheat bread   o Grilled chicken on whole wheat pita with lettuce, tomatoes, cucumbers or tzatziki  o Tuna salad on whole wheat bread: tuna salad made with greek yogurt, olives, red peppers, capers, green onions o Garlic rosemary lamb pita: lamb sauted with garlic, rosemary, salt & pepper; add lettuce, cucumber, greek yogurt to pita - flavor with lemon juice and black pepper  . Seafood:  o Mediterranean grilled salmon, seasoned with garlic, basil, parsley, lemon juice and black pepper o Shrimp, lemon, and spinach whole-grain pasta salad made with low fat greek yogurt  o Seared scallops with lemon  orzo  o Seared tuna steaks seasoned salt, pepper, coriander topped with tomato mixture of olives, tomatoes, olive oil, minced garlic, parsley, green onions and cappers  . Meats:  o Herbed greek chicken salad with kalamata olives, cucumber, feta  o Red bell peppers stuffed with spinach, bulgur, lean ground beef (or lentils) & topped with feta   o Kebabs: skewers of chicken, tomatoes, onions, zucchini, squash  o [place] burgers: made with red onions, mint, dill, lemon juice, feta cheese topped with roasted red peppers . Vegetarian o Cucumber salad: cucumbers, artichoke hearts, celery, red onion, feta cheese, tossed in olive oil & lemon juice  o Hummus and whole grain pita points with a greek salad (lettuce, tomato, feta, olives, cucumbers, red onion) o Lentil soup with celery, carrots made with vegetable broth, garlic, salt and pepper  o Tabouli salad: parsley, bulgur, mint, scallions, cucumbers, tomato, radishes, lemon juice, olive oil, salt and pepper.

## 2015-06-30 DIAGNOSIS — H538 Other visual disturbances: Secondary | ICD-10-CM | POA: Diagnosis not present

## 2015-06-30 DIAGNOSIS — H524 Presbyopia: Secondary | ICD-10-CM | POA: Diagnosis not present

## 2015-06-30 DIAGNOSIS — H2513 Age-related nuclear cataract, bilateral: Secondary | ICD-10-CM | POA: Diagnosis not present

## 2015-07-22 DIAGNOSIS — Z0100 Encounter for examination of eyes and vision without abnormal findings: Secondary | ICD-10-CM | POA: Diagnosis not present

## 2015-09-20 ENCOUNTER — Other Ambulatory Visit: Payer: Self-pay | Admitting: Internal Medicine

## 2015-09-22 NOTE — Telephone Encounter (Signed)
Sent to the pharmacy by e-scribe. 

## 2015-09-28 ENCOUNTER — Other Ambulatory Visit: Payer: Self-pay | Admitting: Allergy and Immunology

## 2015-10-20 IMAGING — CT CT ABD-PELV W/ CM
2 of 5 series · 17 of 46 positions shown, 19 images · IV contrast (READICAT/WATER & [ID] OMNI 300)
Comparison: None.

CLINICAL DATA: Right lower quadrant pain

EXAM:
CT ABDOMEN AND PELVIS WITH CONTRAST
TECHNIQUE: Multidetector CT imaging of the abdomen and pelvis was performed
using the standard protocol following bolus administration of
intravenous contrast.
CONTRAST:  125mL OMNIPAQUE IOHEXOL 300 MG/ML  SOLN

[Series 2: abd/pelvis with · axial · 0.86mm/px · z∈[-456,-41]mm · 14 of 95 slices shown, 16 images]
[im 6/95  soft-tissue]
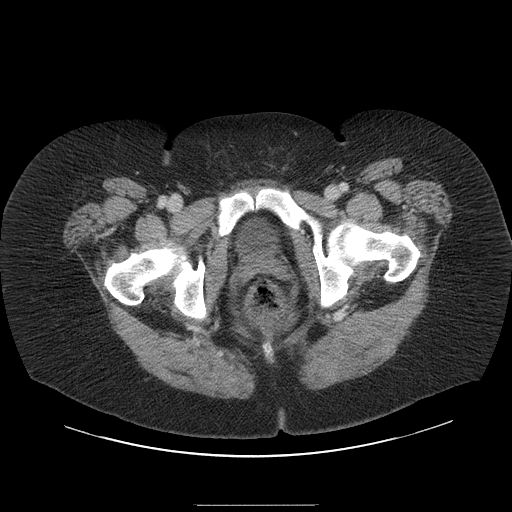
[im 6/95  bone]
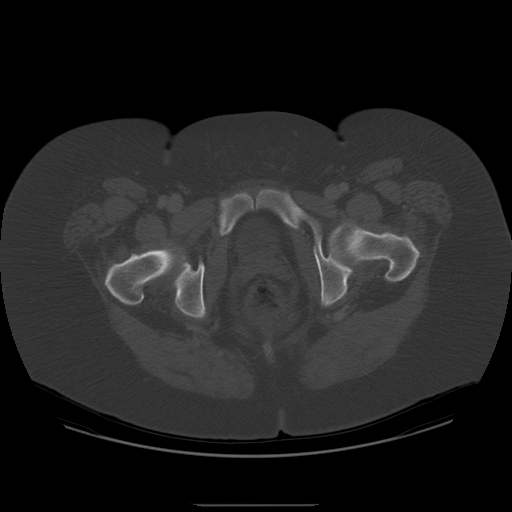
[im 12/95  soft-tissue]
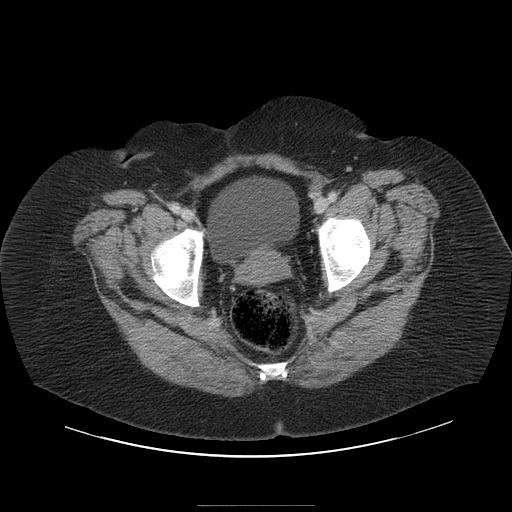
[im 17/95  soft-tissue]
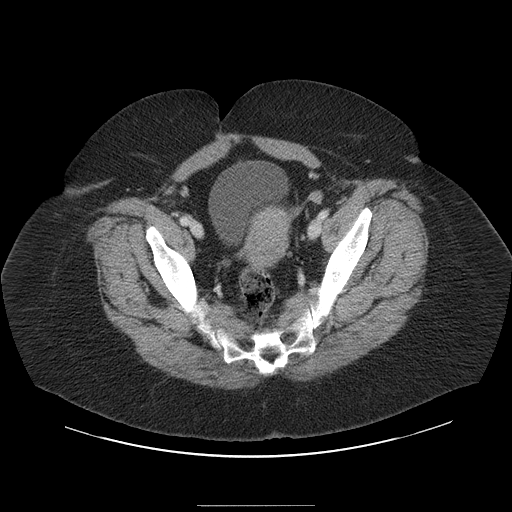
[im 28/95  soft-tissue]
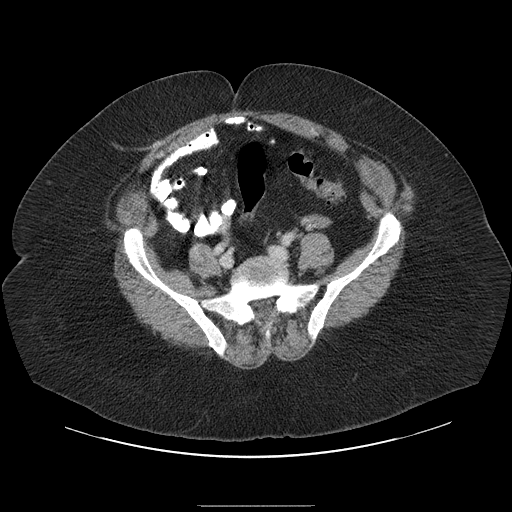
[im 34/95  soft-tissue]
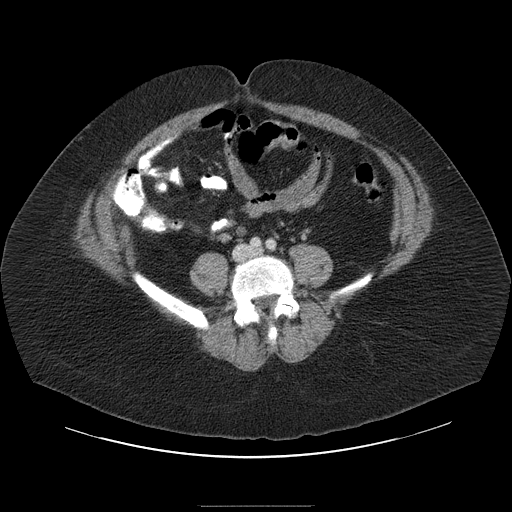
[im 39/95  soft-tissue]
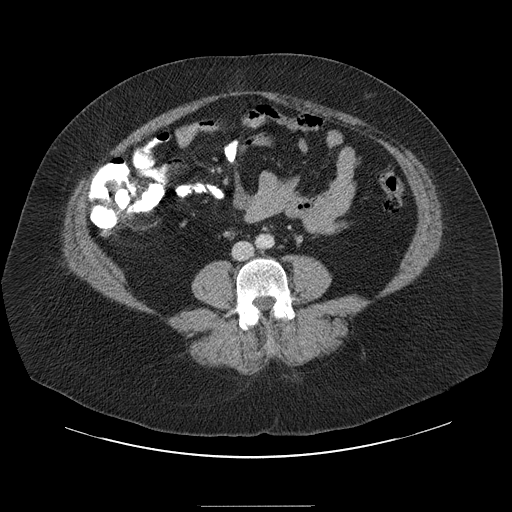
[im 45/95  soft-tissue]
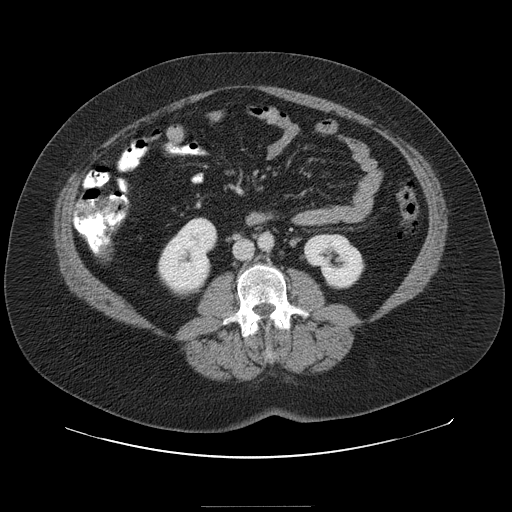
[im 50/95  soft-tissue]
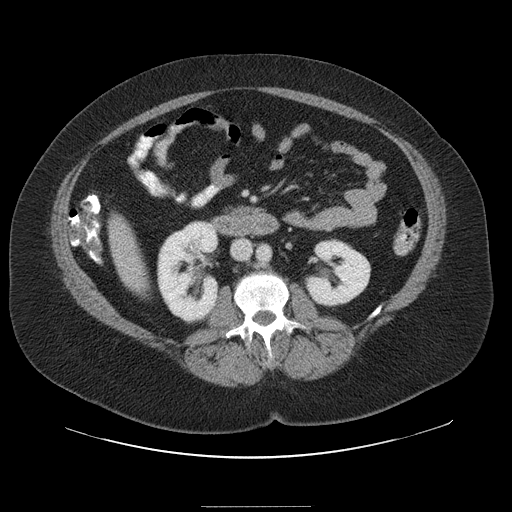
[im 56/95  soft-tissue]
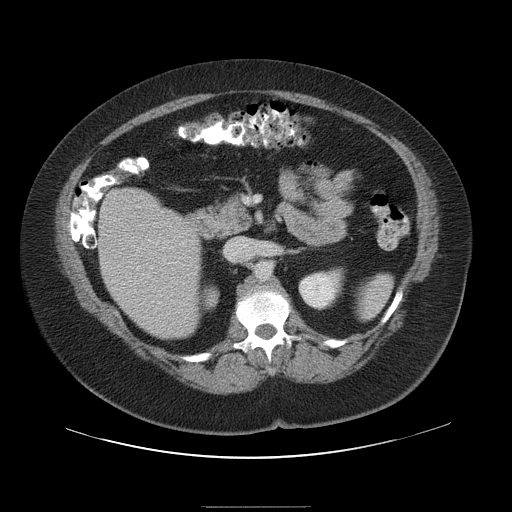
[im 56/95  bone]
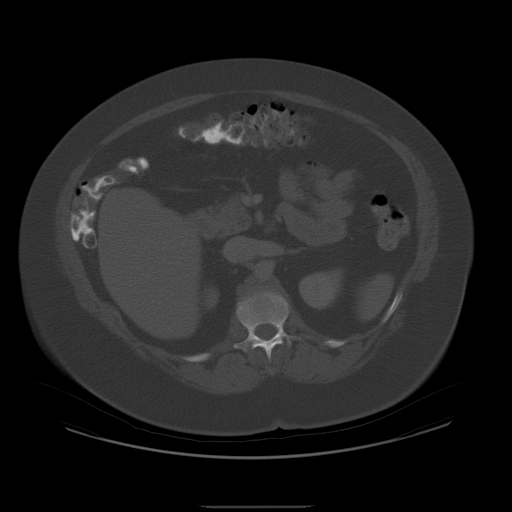
[im 61/95  soft-tissue]
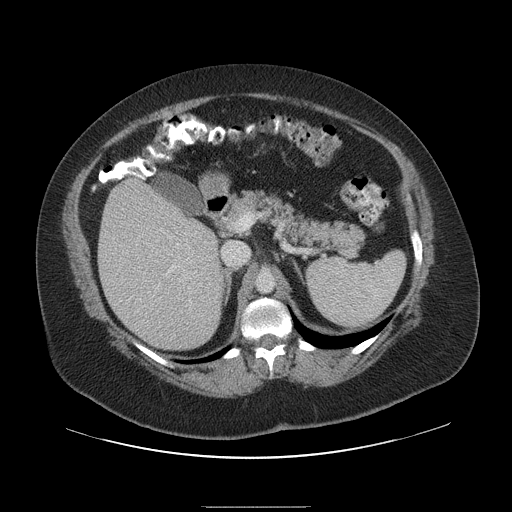
[im 72/95  soft-tissue]
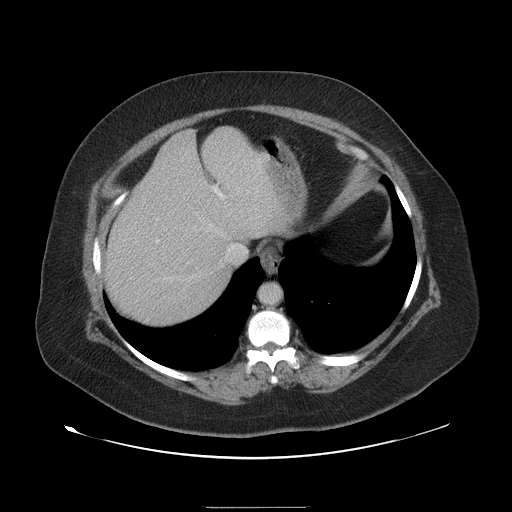
[im 78/95  soft-tissue]
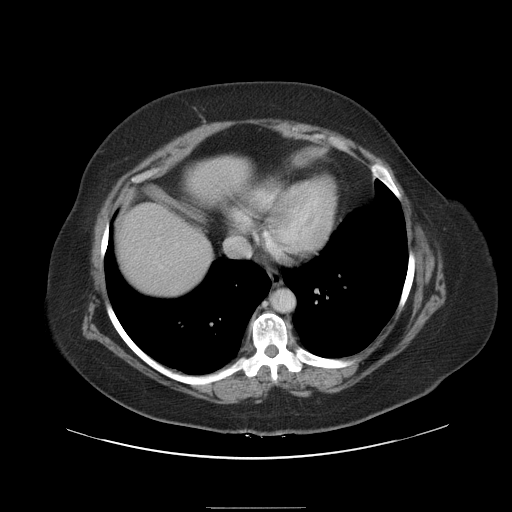
[im 83/95  soft-tissue]
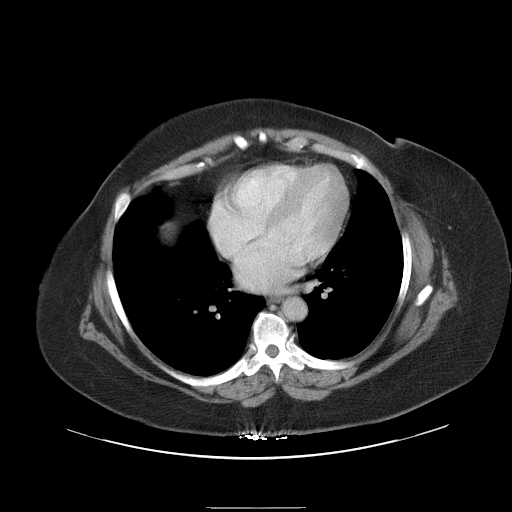
[im 89/95  soft-tissue]
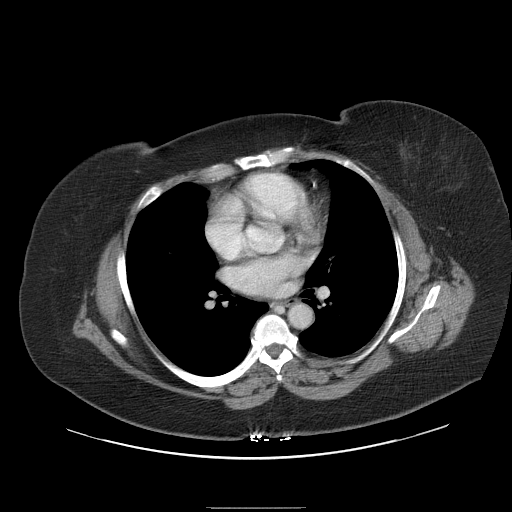

[Series 400: cor · coronal · 1.03mm/px · 3 of 150 slices shown]
[im 50/150  soft-tissue]
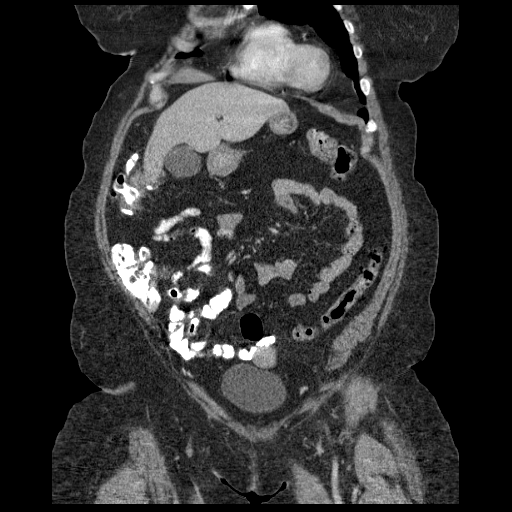
[im 67/150  soft-tissue]
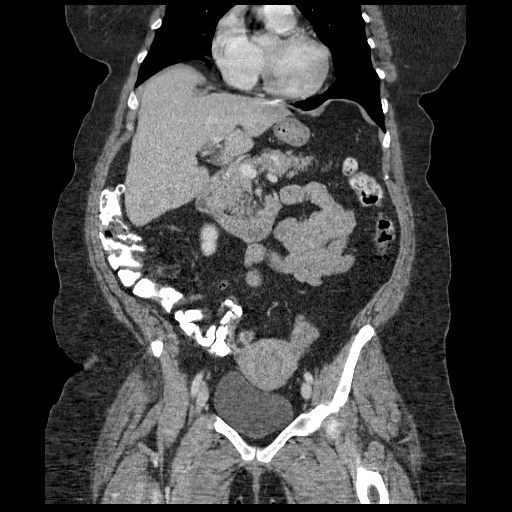
[im 83/150  soft-tissue]
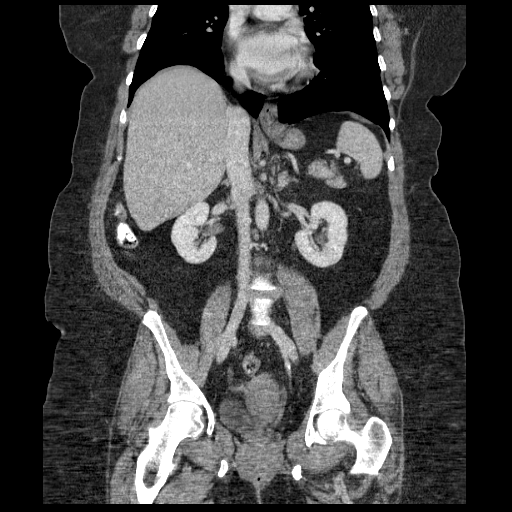

[17 of 46 positions shown; findings below may reference images not displayed]

FINDINGS: Lung bases are clear. Heart size is within normal limits. No acute
bony changes.

Liver gallbladder and bile ducts are normal. Pancreas and spleen are
normal. Kidneys show no obstruction mass or calculi.

Negative for bowel obstruction. The appendix is normal. No bowel
thickening.

Thickening of the endometrium measuring 22 mm. This is abnormal for
age and may represent endometrial neoplasm. Pelvic ultrasound is
suggested. Possible 15 mm fibroid of the fundus of the uterus.

Negative for adenopathy.  No free fluid.
IMPRESSION: Endometrial thickening, worrisome for endometrial neoplasm. Pelvic
ultrasound suggested for further evaluation.

## 2015-10-30 DIAGNOSIS — E559 Vitamin D deficiency, unspecified: Secondary | ICD-10-CM | POA: Diagnosis not present

## 2015-10-30 DIAGNOSIS — R21 Rash and other nonspecific skin eruption: Secondary | ICD-10-CM | POA: Diagnosis not present

## 2015-10-30 DIAGNOSIS — D508 Other iron deficiency anemias: Secondary | ICD-10-CM | POA: Diagnosis not present

## 2015-11-12 ENCOUNTER — Encounter: Payer: Self-pay | Admitting: Adult Health

## 2015-11-12 ENCOUNTER — Ambulatory Visit (INDEPENDENT_AMBULATORY_CARE_PROVIDER_SITE_OTHER): Payer: Commercial Managed Care - HMO | Admitting: Adult Health

## 2015-11-12 VITALS — BP 118/72 | HR 87 | Temp 99.3°F | Ht 61.75 in | Wt 227.9 lb

## 2015-11-12 DIAGNOSIS — J4521 Mild intermittent asthma with (acute) exacerbation: Secondary | ICD-10-CM | POA: Diagnosis not present

## 2015-11-12 MED ORDER — IPRATROPIUM-ALBUTEROL 0.5-2.5 (3) MG/3ML IN SOLN
3.0000 mL | Freq: Once | RESPIRATORY_TRACT | Status: AC
  Administered 2015-11-12: 3 mL via RESPIRATORY_TRACT

## 2015-11-12 MED ORDER — ALBUTEROL SULFATE (2.5 MG/3ML) 0.083% IN NEBU
2.5000 mg | INHALATION_SOLUTION | Freq: Four times a day (QID) | RESPIRATORY_TRACT | Status: DC | PRN
Start: 2015-11-12 — End: 2016-11-29

## 2015-11-12 MED ORDER — METHYLPREDNISOLONE 4 MG PO TBPK: ORAL_TABLET | ORAL | Status: DC

## 2015-11-12 NOTE — Progress Notes (Signed)
Subjective:    Patient ID: Jill Burton, female    DOB: 24-Dec-1945, 70 y.o.   MRN: [id number]  HPI  70 year old female, patient of Dr. Fabian Sharp, presents to the office today with the complaints of wheezing, non productive cough, and the feelings of chest tightness x 3 days.She feels as though she is having an asthma flare. Endorses using her inhalers as she is prescribed, but is not using nebulizer because she is out of the medication.  Denies any fevers or productive cough  Review of Systems  Constitutional: Negative.   Respiratory: Positive for chest tightness, shortness of breath and wheezing.   Cardiovascular: Negative.   Neurological: Negative.   Psychiatric/Behavioral: Positive for sleep disturbance.   Past Medical History  Diagnosis Date  . Allergic rhinitis     allergy testing neg in 2011  . Asthma   . SVD (spontaneous vaginal delivery)     x 1  . Anemia   . Hyperlipidemia     no meds  . Hypertension     does not take bp meds on a regular basis    Social History   Social History  . Marital Status: Married    Spouse Name: N/A  . Number of Children: N/A  . Years of Education: N/A   Occupational History  . Not on file.   Social History Main Topics  . Smoking status: Never Smoker   . Smokeless tobacco: Never Used  . Alcohol Use: No  . Drug Use: No  . Sexual Activity: Yes    Birth Control/ Protection: Surgical   Other Topics Concern  . 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      hh of 2     Past Surgical History  Procedure Laterality Date  . Cesarean section      x3  . Tubal ligation    . Hernia repair      as child  . Dilitation & currettage/hystroscopy with versapoint resection N/A 11/21/2013    Procedure: DILATATION & CURETTAGE/HYSTEROSCOPY WITH VERSAPOINT RESECTION;  Surgeon: Serita Kyle, MD;  Location: [facility];  Service: Gynecology;   Laterality: N/A;    Family History  Problem Relation Age of Onset  . Asthma      Allergies  Allergen Reactions  . Penicillins   . Sulfonamide Derivatives     Current Outpatient Prescriptions on File Prior to Visit  Medication Sig Dispense Refill  . ferrous sulfate 325 (65 FE) MG EC tablet Take 325 mg by mouth daily.    . Fluticasone-Salmeterol (ADVAIR DISKUS) 250-50 MCG/DOSE AEPB as needed    . hydrochlorothiazide (HYDRODIURIL) 25 MG tablet as needed    . montelukast (SINGULAIR) 10 MG tablet TAKE 1 TABLET (10 MG TOTAL) BY MOUTH AT BEDTIME. 30 tablet 5  . Multiple Vitamin (MULTIVITAMIN) tablet Take 1 tablet by mouth every other day.    Marland Kitchen PROAIR HFA 108 (90 BASE) MCG/ACT inhaler INHALE 2 PUFFS EVERY 4 HOURS AS NEEDED 8.5 Inhaler 0   No current facility-administered medications on file prior to visit.    BP 118/72 mmHg  Pulse 87  Temp(Src) 99.3 F (37.4 C) (Oral)  Ht 5' 1.75" (1.568 m)  Wt 227 lb 14.4 oz (103.375 kg)  BMI 42.05 kg/m2  SpO2 96%       Objective:   Physical Exam  Constitutional:  She appears well-developed and well-nourished. No distress.  Cardiovascular: Normal rate, regular rhythm, normal heart sounds and intact distal pulses.  Exam reveals no gallop and no friction rub.   No murmur heard. Pulmonary/Chest: Effort normal. No respiratory distress. She has wheezes (full fields). She has no rales. She exhibits no tenderness.  Decreased breath sounds at bases  Skin: Skin is warm and dry. No rash noted. She is not diaphoretic. No erythema.  Psychiatric: She has a normal mood and affect. Her behavior is normal. Judgment and thought content normal.  Nursing note and vitals reviewed.     Assessment & Plan:  1. Asthma with acute exacerbation, mild intermittent - after nebulizer given her wheezing had resolved and she endorsed feeling like she could breath better.   - methylPREDNISolone (MEDROL DOSEPAK) 4 MG TBPK tablet; Take as directed  Dispense: 21 tablet;  Refill: 0 - albuterol (PROVENTIL) (2.5 MG/3ML) 0.083% nebulizer solution; Take 3 mLs (2.5 mg total) by nebulization every 6 (six) hours as needed for wheezing or shortness of breath.  Dispense: 75 mL; Refill: 3 - ipratropium-albuterol (DUONEB) 0.5-2.5 (3) MG/3ML nebulizer solution 3 mL; Take 3 mLs by nebulization once. - Follow up as needed

## 2015-11-12 NOTE — Progress Notes (Signed)
Pre visit review using our clinic review tool, if applicable. No additional management support is needed unless otherwise documented below in the visit note. 

## 2015-11-12 NOTE — Patient Instructions (Addendum)
It was great meeting you today!  I have sent in a prescription to the pharmacy for a prednisone dose pack as well as albuterol for your nebulizer. Take all medications as directed.   Follow up if your symptoms continue or become worse.    Asthma, Adult Asthma is a recurring condition in which the airways tighten and narrow. Asthma can make it difficult to breathe. It can cause coughing, wheezing, and shortness of breath. Asthma episodes, also called asthma attacks, range from minor to life-threatening. Asthma cannot be cured, but medicines and lifestyle changes can help control it. CAUSES Asthma is believed to be caused by inherited (genetic) and environmental factors, but its exact cause is unknown. Asthma may be triggered by allergens, lung infections, or irritants in the air. Asthma triggers are different for each person. Common triggers include:   Animal dander.  Dust mites.  Cockroaches.  Pollen from trees or grass.  Mold.  Smoke.  Air pollutants such as dust, household cleaners, hair sprays, aerosol sprays, paint fumes, strong chemicals, or strong odors.  Cold air, weather changes, and winds (which increase molds and pollens in the air).  Strong emotional expressions such as crying or laughing hard.  Stress.  Certain medicines (such as aspirin) or types of drugs (such as beta-blockers).  Sulfites in foods and drinks. Foods and drinks that may contain sulfites include dried fruit, potato chips, and sparkling grape juice.  Infections or inflammatory conditions such as the flu, a cold, or an inflammation of the nasal membranes (rhinitis).  Gastroesophageal reflux disease (GERD).  Exercise or strenuous activity. SYMPTOMS Symptoms may occur immediately after asthma is triggered or many hours later. Symptoms include:  Wheezing.  Excessive nighttime or early morning coughing.  Frequent or severe coughing with a common cold.  Chest tightness.  Shortness of  breath. DIAGNOSIS  The diagnosis of asthma is made by a review of your medical history and a physical exam. Tests may also be performed. These may include:  Lung function studies. These tests show how much air you breathe in and out.  Allergy tests.  Imaging tests such as X-rays. TREATMENT  Asthma cannot be cured, but it can usually be controlled. Treatment involves identifying and avoiding your asthma triggers. It also involves medicines. There are 2 classes of medicine used for asthma treatment:   Controller medicines. These prevent asthma symptoms from occurring. They are usually taken every day.  Reliever or rescue medicines. These quickly relieve asthma symptoms. They are used as needed and provide short-term relief. Your health care provider will help you create an asthma action plan. An asthma action plan is a written plan for managing and treating your asthma attacks. It includes a list of your asthma triggers and how they may be avoided. It also includes information on when medicines should be taken and when their dosage should be changed. An action plan may also involve the use of a device called a peak flow meter. A peak flow meter measures how well the lungs are working. It helps you monitor your condition. HOME CARE INSTRUCTIONS   Take medicines only as directed by your health care provider. Speak with your health care provider if you have questions about how or when to take the medicines.  Use a peak flow meter as directed by your health care provider. Record and keep track of readings.  Understand and use the action plan to help minimize or stop an asthma attack without needing to seek medical care.  Control your  home environment in the following ways to help prevent asthma attacks:  Do not smoke. Avoid being exposed to secondhand smoke.  Change your heating and air conditioning filter regularly.  Limit your use of fireplaces and wood stoves.  Get rid of pests (such as  roaches and mice) and their droppings.  Throw away plants if you see mold on them.  Clean your floors and dust regularly. Use unscented cleaning products.  Try to have someone else vacuum for you regularly. Stay out of rooms while they are being vacuumed and for a short while afterward. If you vacuum, use a dust mask from a hardware store, a double-layered or microfilter vacuum cleaner bag, or a vacuum cleaner with a HEPA filter.  Replace carpet with wood, tile, or vinyl flooring. Carpet can trap dander and dust.  Use allergy-proof pillows, mattress covers, and box spring covers.  Wash bed sheets and blankets every week in hot water and dry them in a dryer.  Use blankets that are made of polyester or cotton.  Clean bathrooms and kitchens with bleach. If possible, have someone repaint the walls in these rooms with mold-resistant paint. Keep out of the rooms that are being cleaned and painted.  Wash hands frequently. SEEK MEDICAL CARE IF:   You have wheezing, shortness of breath, or a cough even if taking medicine to prevent attacks.  The colored mucus you cough up (sputum) is thicker than usual.  Your sputum changes from clear or white to yellow, green, gray, or bloody.  You have any problems that may be related to the medicines you are taking (such as a rash, itching, swelling, or trouble breathing).  You are using a reliever medicine more than 2-3 times per week.  Your peak flow is still at 50-79% of your personal best after following your action plan for 1 hour.  You have a fever. SEEK IMMEDIATE MEDICAL CARE IF:   You seem to be getting worse and are unresponsive to treatment during an asthma attack.  You are short of breath even at rest.  You get short of breath when doing very little physical activity.  You have difficulty eating, drinking, or talking due to asthma symptoms.  You develop chest pain.  You develop a fast heartbeat.  You have a bluish color to your  lips or fingernails.  You are light-headed, dizzy, or faint.  Your peak flow is less than 50% of your personal best.   This information is not intended to replace advice given to you by your health care provider. Make sure you discuss any questions you have with your health care provider.   Document Released: 10/03/2005 Document Revised: 06/24/2015 Document Reviewed: 05/02/2013 [employer] Interactive Patient Education [employer]

## 2015-12-02 DIAGNOSIS — J45909 Unspecified asthma, uncomplicated: Secondary | ICD-10-CM | POA: Diagnosis not present

## 2015-12-03 ENCOUNTER — Ambulatory Visit: Payer: Self-pay | Admitting: Allergy and Immunology

## 2016-04-13 ENCOUNTER — Encounter: Payer: Self-pay | Admitting: Internal Medicine

## 2016-04-13 ENCOUNTER — Ambulatory Visit (INDEPENDENT_AMBULATORY_CARE_PROVIDER_SITE_OTHER): Payer: Commercial Managed Care - HMO | Admitting: Internal Medicine

## 2016-04-13 VITALS — BP 146/80 | HR 70 | Temp 98.1°F | Ht 61.75 in | Wt 229.8 lb

## 2016-04-13 DIAGNOSIS — R635 Abnormal weight gain: Secondary | ICD-10-CM | POA: Diagnosis not present

## 2016-04-13 DIAGNOSIS — R739 Hyperglycemia, unspecified: Secondary | ICD-10-CM

## 2016-04-13 DIAGNOSIS — K5909 Other constipation: Secondary | ICD-10-CM | POA: Diagnosis not present

## 2016-04-13 LAB — BASIC METABOLIC PANEL
BUN: 10 mg/dL (ref 6–23)
CO2: 28 mEq/L (ref 19–32)
Calcium: 9.6 mg/dL (ref 8.4–10.5)
Chloride: 104 mEq/L (ref 96–112)
Creatinine, Ser: 0.76 mg/dL (ref 0.40–1.20)
GFR: 97.08 mL/min (ref >60.00)
Glucose, Bld: 85 mg/dL (ref 70–99)
Potassium: 4 mEq/L (ref 3.5–5.1)
Sodium: 141 mEq/L (ref 135–145)

## 2016-04-13 LAB — CBC WITH DIFFERENTIAL/PLATELET
Basophils Absolute: 0 10*3/uL (ref 0.0–0.1)
Basophils Relative: 0.4 % (ref 0.0–3.0)
Eosinophils Absolute: 0.1 10*3/uL (ref 0.0–0.7)
Eosinophils Relative: 1.2 % (ref 0.0–5.0)
HCT: 43.2 % (ref 36.0–46.0)
Hemoglobin: 14.2 g/dL (ref 12.0–15.0)
Lymphocytes Relative: 27.2 % (ref 12.0–46.0)
Lymphs Abs: 2.2 10*3/uL (ref 0.7–4.0)
MCHC: 32.9 g/dL (ref 30.0–36.0)
MCV: 86.8 fl (ref 78.0–100.0)
Monocytes Absolute: 0.5 10*3/uL (ref 0.1–1.0)
Monocytes Relative: 6 % (ref 3.0–12.0)
Neutro Abs: 5.3 10*3/uL (ref 1.4–7.7)
Neutrophils Relative %: 65.2 % (ref 43.0–77.0)
Platelets: 221 10*3/uL (ref 150.0–400.0)
RBC: 4.98 Mil/uL (ref 3.87–5.11)
RDW: 13.9 % (ref 11.5–15.5)
WBC: 8.1 10*3/uL (ref 4.0–10.5)

## 2016-04-13 LAB — HEPATIC FUNCTION PANEL
ALT: 22 U/L (ref 0–35)
AST: 20 U/L (ref 0–37)
Albumin: 4.6 g/dL (ref 3.5–5.2)
Alkaline Phosphatase: 84 U/L (ref 39–117)
Bilirubin, Direct: 0.1 mg/dL (ref 0.0–0.3)
Total Bilirubin: 0.6 mg/dL (ref 0.2–1.2)
Total Protein: 6.9 g/dL (ref 6.0–8.3)

## 2016-04-13 LAB — TSH: TSH: 0.97 u[IU]/mL (ref 0.35–4.50)

## 2016-04-13 LAB — LIPID PANEL
Cholesterol: 272 mg/dL — ABNORMAL HIGH (ref 0–200)
HDL: 67.7 mg/dL (ref >39.00)
LDL Cholesterol: 184 mg/dL — ABNORMAL HIGH (ref 0–99)
NonHDL: 203.85
Total CHOL/HDL Ratio: 4
Triglycerides: 100 mg/dL (ref 0.0–149.0)
VLDL: 20 mg/dL (ref 0.0–40.0)

## 2016-04-13 LAB — HEMOGLOBIN A1C: Hgb A1c MFr Bld: 6.1 % (ref 4.6–6.5)

## 2016-04-13 LAB — T3, FREE: T3, Free: 3.1 pg/mL (ref 2.3–4.2)

## 2016-04-13 LAB — T4, FREE: Free T4: 0.91 ng/dL (ref 0.60–1.60)

## 2016-04-13 MED ORDER — TRIAMCINOLONE ACETONIDE 0.025 % EX OINT: 1.0000 "application " | TOPICAL_OINTMENT | Freq: Two times a day (BID) | CUTANEOUS | Status: DC

## 2016-04-13 NOTE — Progress Notes (Signed)
Pre visit review using our clinic review tool, if applicable. No additional management support is needed unless otherwise documented below in the visit note.  Chief Complaint  Patient presents with  . Fatigue  . Fussy  . Constipation  . Dry Skin    HPI: Jill Burton 70 y.o.  Comes in for sda appt  Co fatigue  She has asthma of reactive airways  Hx of HT     tired and  Dry skin  Wonders if thyroid is off or now.    Uses prun juice for constipations   Weight  Is an issues.   Nutritionist says couldn't do unless dm .  Breathing comes and goes .    Allergist   Dr Willa RoughHicks    At times.  Ask about thyroid because of her symptoms. She thought her blood pressure was good she went off the amlodipine. Is getting about 140 or 142 at home. Thinks her feet swell at times. Family history of heart attack. States she went to nutritionist once but they continue because her insurance wouldn't pay for it unless she was diabetic. She thought it was helpful thinks of looking into it again. ROS: See pertinent positives and negatives per HPI. No cp sob hemoptysis    Past Medical History  Diagnosis Date  . Allergic rhinitis     allergy testing neg in 2011  . Asthma   . SVD (spontaneous vaginal delivery)     x 1  . Anemia   . Hyperlipidemia     no meds  . Hypertension     does not take bp meds on a regular basis    Family History  Problem Relation Age of Onset  . Asthma      Social History   Social History  . Marital Status: Married    Spouse Name: N/A  . Number of Children: N/A  . Years of Education: N/A   Social History Main Topics  . Smoking status: Never Smoker   . Smokeless tobacco: Never Used  . Alcohol Use: No  . Drug Use: No  . Sexual Activity: Yes    Birth Control/ Protection: Surgical   Other Topics Concern  . 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      hh of 2     Outpatient  Prescriptions Prior to Visit  Medication Sig Dispense Refill  . albuterol (PROVENTIL) (2.5 MG/3ML) 0.083% nebulizer solution Take 3 mLs (2.5 mg total) by nebulization every 6 (six) hours as needed for wheezing or shortness of breath. 75 mL 3  . Fluticasone-Salmeterol (ADVAIR DISKUS) 250-50 MCG/DOSE AEPB as needed    . hydrochlorothiazide (HYDRODIURIL) 25 MG tablet as needed    . montelukast (SINGULAIR) 10 MG tablet TAKE 1 TABLET (10 MG TOTAL) BY MOUTH AT BEDTIME. 30 tablet 5  . PROAIR HFA 108 (90 BASE) MCG/ACT inhaler INHALE 2 PUFFS EVERY 4 HOURS AS NEEDED 8.5 Inhaler 0  . ferrous sulfate 325 (65 FE) MG EC tablet Take 325 mg by mouth daily. Reported on 04/13/2016    . Multiple Vitamin (MULTIVITAMIN) tablet Take 1 tablet by mouth every other day. Reported on 04/13/2016    . methylPREDNISolone (MEDROL DOSEPAK) 4 MG TBPK tablet Take as directed 21 tablet 0   No facility-administered medications prior to visit.     EXAM:  BP 146/80 mmHg  Pulse 70  Temp(Src) 98.1 F (36.7 C) (Oral)  Ht 5' 1.75" (1.568 m)  Wt 229 lb 12.8 oz (104.237 kg)  BMI 42.40 kg/m2  SpO2 98%  Body mass index is 42.4 kg/(m^2).  GENERAL: vitals reviewed and listed above, alert, oriented, appears well hydrated and in no acute distress HEENT: atraumatic, conjunctiva  clear, no obvious abnormalities on inspection of external nose and ears OP : no lesion edema or exudate  NECK: no obvious masses on inspection palpation  LUNGS: Occasional expiratory wheeze. o wheezes, rales or rhonchi, mildy tight  CV: HRRR, no clubbing cyanosis or  peripheral edema nl cap refill  MS: moves all extremities without noticeable focal  Abnormality Abdomen:  Sof,t normal bowel sounds without hepatosplenomegaly, no guarding rebound or masses no CVA tenderness Dry skin with some itchy patches in the right lower extremity. Some hyperpigmentation. PSYCH: pleasant and cooperative, no obvious depression or anxiety  Lab Results  Component Value Date    WBC 8.1 04/13/2016   HGB 14.2 04/13/2016   HCT 43.2 04/13/2016   PLT 221.0 04/13/2016   GLUCOSE 85 04/13/2016   CHOL 272* 04/13/2016   TRIG 100.0 04/13/2016   HDL 67.70 04/13/2016   LDLDIRECT 157.4 09/23/2013   LDLCALC 184* 04/13/2016   ALT 22 04/13/2016   AST 20 04/13/2016   NA 141 04/13/2016   K 4.0 04/13/2016   CL 104 04/13/2016   CREATININE 0.76 04/13/2016   BUN 10 04/13/2016   CO2 28 04/13/2016   TSH 0.97 04/13/2016   HGBA1C 6.1 04/13/2016   BP Readings from Last 3 Encounters:  04/13/16 146/80  11/12/15 118/72  06/03/15 138/78   Wt Readings from Last 3 Encounters:  04/13/16 229 lb 12.8 oz (104.237 kg)  11/12/15 227 lb 14.4 oz (103.375 kg)  06/03/15 227 lb (102.967 kg)      ASSESSMENT AND PLAN:  Discussed the following assessment and plan:  Weight gain - Plan: Basic metabolic panel, CBC with Differential/Platelet, Hemoglobin A1c, Hepatic function panel, TSH, Lipid panel, T4, free, T3, Free  Hyperglycemia - Plan: Basic metabolic panel, CBC with Differential/Platelet, Hemoglobin A1c, Hepatic function panel, TSH, Lipid panel, T4, free, T3, Free  Other constipation - Plan: Basic metabolic panel, CBC with Differential/Platelet, Hemoglobin A1c, Hepatic function panel, TSH, Lipid panel, T4, free, T3, Free  Morbid obesity, unspecified obesity type (HCC) Hx of  Thyroid off per  Other provider    And hx of hmeopathy  Alternative med .  Seen acutey in jan for astham exacerbationrx with duoneb and dosepack  A while back on advair controller now  She had been rx for ht with amlodipine  Dry skin and itchy possible eczema variant prescription sent him for topical steroid. -Patient advised to return or notify health care team  if symptoms worsen ,persist or new concerns arise. Total visit > 50% spent counseling and coordinating care as indicated in above note and in instructions to patient .   Patient Instructions   Will notify you  of labs when available.   can  try cream for  Eczema     Areas for up to 2 weeks  Stay on moisturizer .  Healthy weight loss  Will help.    Let us know  About referral  Back to nutrition .   If insurance will pay.   BP should be below 140/90 to protect from heart attack heart failure and strokes . Your BP is too hight today  If  abover 140 at home    Then  take 1/2 ( 12.5 mg )  Of the  HCTZ everyday ( no pt says gets cramps  y    Or other BP medicine if want a different one .  Can take the amlodipine she has had home        Why follow it? Research shows. . Those who follow the Mediterranean diet have a reduced risk of heart disease  . The diet is associated with a reduced incidence of Parkinson's and Alzheimer's diseases . People following the diet may have longer life expectancies and lower rates of chronic diseases  . The Dietary Guidelines for Americans recommends the Mediterranean diet as an eating plan to promote health and prevent disease  What Is the Mediterranean Diet?  . Healthy eating plan based on typical foods and recipes of Mediterranean-style cooking . The diet is primarily a plant based diet; these foods should make up a majority of meals   Starches - Plant based foods should make up a majority of meals - They are an important sources of vitamins, minerals, energy, antioxidants, and fiber - Choose whole grains, foods high in fiber and minimally processed items  - Typical grain sources include wheat, oats, barley, corn, brown rice, bulgar, farro, millet, polenta, couscous  - Various types of beans include chickpeas, lentils, fava beans, black beans, white beans   Fruits  Veggies - Large quantities of antioxidant rich fruits & veggies; 6 or more servings  - Vegetables can be eaten raw or lightly drizzled with oil and cooked  - Vegetables common to the traditional Mediterranean Diet include: artichokes, arugula, beets, broccoli, brussel sprouts, cabbage, carrots, celery, collard greens, cucumbers, eggplant,  kale, leeks, lemons, lettuce, mushrooms, okra, onions, peas, peppers, potatoes, pumpkin, radishes, rutabaga, shallots, spinach, sweet potatoes, turnips, zucchini - Fruits common to the Mediterranean Diet include: apples, apricots, avocados, cherries, clementines, dates, figs, grapefruits, grapes, melons, nectarines, oranges, peaches, pears, pomegranates, strawberries, tangerines  Fats - Replace butter and margarine with healthy oils, such as olive oil, canola oil, and tahini  - Limit nuts to no more than a handful a day  - Nuts include walnuts, almonds, pecans, pistachios, pine nuts  - Limit or avoid candied, honey roasted or heavily salted nuts - Olives are central to the [employer]iterranean diet - can be eaten whole or used in a variety of dishes   Meats Protein - Limiting red meat: no more than a few times a month - When eating red meat: choose lean cuts and keep the portion to the size of deck of cards - Eggs: approx. 0 to 4 times a week  - Fish and lean poultry: at least 2 a week  - Healthy protein sources include, chicken, [place], lean beef, lamb - Increase intake of seafood such as tuna, salmon, trout, mackerel, shrimp, scallops - Avoid or limit high fat processed meats such as sausage and bacon  Dairy - Include moderate amounts of low fat dairy products  - Focus on healthy dairy such as fat free yogurt, skim milk, low or reduced fat cheese - Limit dairy products higher in fat such as whole or 2% milk, cheese, ice cream  Alcohol - Moderate amounts of red wine is ok  - No more than 5 oz daily for women (all ages) and men older than age 69  - No more than 10 oz of wine daily for men younger than 3565  Other - Limit sweets and other desserts  - Use herbs and spices instead of salt to flavor  foods  - Herbs and spices common to the traditional Mediterranean Diet include: basil, bay leaves, chives, cloves, cumin, fennel, garlic, lavender, marjoram, mint, oregano, parsley, pepper, rosemary, sage,  savory, sumac, tarragon, thyme   It's not just a diet, it's a lifestyle:  . The Mediterranean diet includes lifestyle factors typical of those in the region  . Foods, drinks and meals are best eaten with others and savored . Daily physical activity is important for overall good health . This could be strenuous exercise like running and aerobics . This could also be more leisurely activities such as walking, housework, yard-work, or taking the stairs . Moderation is the key; a balanced and healthy diet accommodates most foods and drinks . Consider portion sizes and frequency of consumption of certain foods   Meal Ideas & Options:  . Breakfast:  o Whole wheat toast or whole wheat English muffins with peanut butter & hard boiled egg o Steel cut oats topped with apples & cinnamon and skim milk  o Fresh fruit: banana, strawberries, melon, berries, peaches  o Smoothies: strawberries, bananas, greek yogurt, peanut butter o Low fat greek yogurt with blueberries and granola  o Egg white omelet with spinach and mushrooms o Breakfast couscous: whole wheat couscous, apricots, skim milk, cranberries  . Sandwiches:  o Hummus and grilled vegetables (peppers, zucchini, squash) on whole wheat bread   o Grilled chicken on whole wheat pita with lettuce, tomatoes, cucumbers or tzatziki  o Tuna salad on whole wheat bread: tuna salad made with greek yogurt, olives, red peppers, capers, green onions o Garlic rosemary lamb pita: lamb sauted with garlic, rosemary, salt & pepper; add lettuce, cucumber, greek yogurt to pita - flavor with lemon juice and black pepper  . Seafood:  o Mediterranean grilled salmon, seasoned with garlic, basil, parsley, lemon juice and black pepper o Shrimp, lemon, and spinach whole-grain pasta salad made with low fat greek yogurt  o Seared scallops with lemon orzo  o Seared tuna steaks seasoned salt, pepper, coriander topped with tomato mixture of olives, tomatoes, olive oil, minced  garlic, parsley, green onions and cappers  . Meats:  o Herbed greek chicken salad with kalamata olives, cucumber, feta  o Red bell peppers stuffed with spinach, bulgur, lean ground beef (or lentils) & topped with feta   o Kebabs: skewers of chicken, tomatoes, onions, zucchini, squash  o [place] burgers: made with red onions, mint, dill, lemon juice, feta cheese topped with roasted red peppers . Vegetarian o Cucumber salad: cucumbers, artichoke hearts, celery, red onion, feta cheese, tossed in olive oil & lemon juice  o Hummus and whole grain pita points with a greek salad (lettuce, tomato, feta, olives, cucumbers, red onion) o Lentil soup with celery, carrots made with vegetable broth, garlic, salt and pepper  o Tabouli salad: parsley, bulgur, mint, scallions, cucumbers, tomato, radishes, lemon juice, olive oil, salt and pepper.            Neta Mends. Clinten Howk M.D.

## 2016-04-13 NOTE — Patient Instructions (Addendum)
Will notify you  of labs when available.   can try cream for  Eczema     Areas for up to 2 weeks  Stay on moisturizer .  Healthy weight loss  Will help.    Let us know  About referral  Back to nutrition .   If insurance will pay.   BP should be below 140/90 to protect from heart attack heart failure and strokes . Your BP is too hight today  If  abover 140 at home    Then take 1/2 ( 12.5 mg )  Of the  HCTZ everyday ( no pt says gets cramps  y    Or other BP medicine if want a different one .  Can take the amlodipine she has had home        Why follow it? Research shows. . Those who follow the Mediterranean diet have a reduced risk of heart disease  . The diet is associated with a reduced incidence of Parkinson's and Alzheimer's diseases . People following the diet may have longer life expectancies and lower rates of chronic diseases  . The Dietary Guidelines for Americans recommends the Mediterranean diet as an eating plan to promote health and prevent disease  What Is the Mediterranean Diet?  . Healthy eating plan based on typical foods and recipes of Mediterranean-style cooking . The diet is primarily a plant based diet; these foods should make up a majority of meals   Starches - Plant based foods should make up a majority of meals - They are an important sources of vitamins, minerals, energy, antioxidants, and fiber - Choose whole grains, foods high in fiber and minimally processed items  - Typical grain sources include wheat, oats, barley, corn, brown rice, bulgar, farro, millet, polenta, couscous  - Various types of beans include chickpeas, lentils, fava beans, black beans, white beans   Fruits  Veggies - Large quantities of antioxidant rich fruits & veggies; 6 or more servings  - Vegetables can be eaten raw or lightly drizzled with oil and cooked  - Vegetables common to the traditional Mediterranean Diet include: artichokes, arugula, beets, broccoli, brussel sprouts, cabbage,  carrots, celery, collard greens, cucumbers, eggplant, kale, leeks, lemons, lettuce, mushrooms, okra, onions, peas, peppers, potatoes, pumpkin, radishes, rutabaga, shallots, spinach, sweet potatoes, turnips, zucchini - Fruits common to the Mediterranean Diet include: apples, apricots, avocados, cherries, clementines, dates, figs, grapefruits, grapes, melons, nectarines, oranges, peaches, pears, pomegranates, strawberries, tangerines  Fats - Replace butter and margarine with healthy oils, such as olive oil, canola oil, and tahini  - Limit nuts to no more than a handful a day  - Nuts include walnuts, almonds, pecans, pistachios, pine nuts  - Limit or avoid candied, honey roasted or heavily salted nuts - Olives are central to the [employer] diet - can be eaten whole or used in a variety of dishes   Meats Protein - Limiting red meat: no more than a few times a month - When eating red meat: choose lean cuts and keep the portion to the size of deck of cards - Eggs: approx. 0 to 4 times a week  - Fish and lean poultry: at least 2 a week  - Healthy protein sources include, chicken, [place], lean beef, lamb - Increase intake of seafood such as tuna, salmon, trout, mackerel, shrimp, scallops - Avoid or limit high fat processed meats such as sausage and bacon  Dairy - Include moderate amounts of low fat dairy products  - Focus on  healthy dairy such as fat free yogurt, skim milk, low or reduced fat cheese - Limit dairy products higher in fat such as whole or 2% milk, cheese, ice cream  Alcohol - Moderate amounts of red wine is ok  - No more than 5 oz daily for women (all ages) and men older than age 69  - No more than 10 oz of wine daily for men younger than 2165  Other - Limit sweets and other desserts  - Use herbs and spices instead of salt to flavor foods  - Herbs and spices common to the traditional Mediterranean Diet include: basil, bay leaves, chives, cloves, cumin, fennel, garlic, lavender, marjoram,  mint, oregano, parsley, pepper, rosemary, sage, savory, sumac, tarragon, thyme   It's not just a diet, it's a lifestyle:  . The Mediterranean diet includes lifestyle factors typical of those in the region  . Foods, drinks and meals are best eaten with others and savored . Daily physical activity is important for overall good health . This could be strenuous exercise like running and aerobics . This could also be more leisurely activities such as walking, housework, yard-work, or taking the stairs . Moderation is the key; a balanced and healthy diet accommodates most foods and drinks . Consider portion sizes and frequency of consumption of certain foods   Meal Ideas & Options:  . Breakfast:  o Whole wheat toast or whole wheat English muffins with peanut butter & hard boiled egg o Steel cut oats topped with apples & cinnamon and skim milk  o Fresh fruit: banana, strawberries, melon, berries, peaches  o Smoothies: strawberries, bananas, greek yogurt, peanut butter o Low fat greek yogurt with blueberries and granola  o Egg white omelet with spinach and mushrooms o Breakfast couscous: whole wheat couscous, apricots, skim milk, cranberries  . Sandwiches:  o Hummus and grilled vegetables (peppers, zucchini, squash) on whole wheat bread   o Grilled chicken on whole wheat pita with lettuce, tomatoes, cucumbers or tzatziki  o Tuna salad on whole wheat bread: tuna salad made with greek yogurt, olives, red peppers, capers, green onions o Garlic rosemary lamb pita: lamb sauted with garlic, rosemary, salt & pepper; add lettuce, cucumber, greek yogurt to pita - flavor with lemon juice and black pepper  . Seafood:  o Mediterranean grilled salmon, seasoned with garlic, basil, parsley, lemon juice and black pepper o Shrimp, lemon, and spinach whole-grain pasta salad made with low fat greek yogurt  o Seared scallops with lemon orzo  o Seared tuna steaks seasoned salt, pepper, coriander topped with tomato  mixture of olives, tomatoes, olive oil, minced garlic, parsley, green onions and cappers  . Meats:  o Herbed greek chicken salad with kalamata olives, cucumber, feta  o Red bell peppers stuffed with spinach, bulgur, lean ground beef (or lentils) & topped with feta   o Kebabs: skewers of chicken, tomatoes, onions, zucchini, squash  o [place] burgers: made with red onions, mint, dill, lemon juice, feta cheese topped with roasted red peppers . Vegetarian o Cucumber salad: cucumbers, artichoke hearts, celery, red onion, feta cheese, tossed in olive oil & lemon juice  o Hummus and whole grain pita points with a greek salad (lettuce, tomato, feta, olives, cucumbers, red onion) o Lentil soup with celery, carrots made with vegetable broth, garlic, salt and pepper  o Tabouli salad: parsley, bulgur, mint, scallions, cucumbers, tomato, radishes, lemon juice, olive oil, salt and pepper.

## 2016-04-19 ENCOUNTER — Encounter: Payer: Self-pay | Admitting: Internal Medicine

## 2016-04-20 ENCOUNTER — Other Ambulatory Visit: Payer: Self-pay | Admitting: Family Medicine

## 2016-04-20 DIAGNOSIS — E785 Hyperlipidemia, unspecified: Secondary | ICD-10-CM

## 2016-04-20 NOTE — Telephone Encounter (Signed)
Pt would like a call back concerning her lab results.   Also, pt states the nutritionalist dr Fabian Sharppanosh referred her to does not accept her insurance. Would like another referral  [phone]

## 2016-06-08 DIAGNOSIS — M25461 Effusion, right knee: Secondary | ICD-10-CM | POA: Diagnosis not present

## 2016-06-13 DIAGNOSIS — H5200 Hypermetropia, unspecified eye: Secondary | ICD-10-CM | POA: Diagnosis not present

## 2016-06-13 DIAGNOSIS — H2510 Age-related nuclear cataract, unspecified eye: Secondary | ICD-10-CM | POA: Diagnosis not present

## 2016-06-13 DIAGNOSIS — H5210 Myopia, unspecified eye: Secondary | ICD-10-CM | POA: Diagnosis not present

## 2016-06-16 ENCOUNTER — Ambulatory Visit: Payer: Commercial Managed Care - HMO | Admitting: Adult Health

## 2016-06-16 ENCOUNTER — Telehealth: Payer: Self-pay | Admitting: Internal Medicine

## 2016-06-16 DIAGNOSIS — J45909 Unspecified asthma, uncomplicated: Secondary | ICD-10-CM

## 2016-06-16 NOTE — Telephone Encounter (Signed)
° °  Pt call to say that she was a pt of Dr [facility] and she is no longer there. So they are starting her as a new pt so therefore she will need a referral.   Pt req a referral to [facility]    Dr Delorse LekPadgett

## 2016-06-17 NOTE — Telephone Encounter (Signed)
Referral placed in the system. 

## 2016-06-30 ENCOUNTER — Encounter: Payer: Self-pay | Admitting: Allergy

## 2016-06-30 ENCOUNTER — Ambulatory Visit (INDEPENDENT_AMBULATORY_CARE_PROVIDER_SITE_OTHER): Payer: Commercial Managed Care - HMO | Admitting: Allergy

## 2016-06-30 VITALS — BP 138/86 | HR 64 | Resp 18

## 2016-06-30 DIAGNOSIS — J310 Chronic rhinitis: Secondary | ICD-10-CM | POA: Diagnosis not present

## 2016-06-30 DIAGNOSIS — J4531 Mild persistent asthma with (acute) exacerbation: Secondary | ICD-10-CM | POA: Diagnosis not present

## 2016-06-30 MED ORDER — TRIAMCINOLONE ACETONIDE 55 MCG/ACT NA AERO: 2.0000 | INHALATION_SPRAY | Freq: Every day | NASAL | 5 refills | Status: DC

## 2016-06-30 MED ORDER — FLUTICASONE FUROATE-VILANTEROL 200-25 MCG/INH IN AEPB: 1.0000 | INHALATION_SPRAY | Freq: Every day | RESPIRATORY_TRACT | 5 refills | Status: DC

## 2016-06-30 NOTE — Progress Notes (Signed)
Follow-up Note  RE: Jill HazelJuanita W Dini MRN: [id number] DOB: May 06, 1946 Date of Office Visit: 06/30/2016   History of present illness: Jill Burton is a 70 y.o. female presenting today for follow-up of asthma and chronic rhinitis. She was last seen in our office by Dr. Willa RoughHicks on June 2016.  W ith her asthma she currently has been having issues for the past 3 weeks or so. She said that she has been feeling a lot of chest tightness and wheeziness. she has been using advair 250 diskus and albuterol. Advair she has been taking it twice a day for the past 3 weeks.  Prior to that she states that she would take the Advair usually 1 inhalation daily when she felt somewhat symptomatic and  when she was feeling well she does not take it at all.   She also reports that she tries to not use the albuterol if she can.  However she has been using over the past 3 weeks especially at night. She reports she has a cough that is worse at night. She has been having nighttime awakenings where she will use her albuterol.  She feels she has had a least one round of oral steroids for an exacerbation over past year. She takes singulair at night.  For her rhinitis she takes Claritin  D. She states she was advised by her PCP to just use plain Claritin. She does not feel that regular plain Claritin is as helpful as Claritin-D. She also  has nasacort which she reports helps when she needs to use however she has not been using .   She has been getting dry patches that "pop up" on her body that is itchy and dry and hyperpigmented. She has triamcinolone that she has been using and has found to be helpful.       Review of systems: Review of Systems  Constitutional: Negative for chills and fever.  HENT: Positive for congestion. Negative for sore throat.   Eyes: Negative for redness.  Respiratory: Positive for cough, shortness of breath and wheezing.   Cardiovascular: Negative for chest pain.  Gastrointestinal: Positive  for constipation. Negative for nausea and vomiting.  Skin: Positive for itching and rash.  Neurological: Negative for headaches.    All other systems negative unless noted above in HPI  Past medical/social/surgical/family history have been reviewed and are unchanged unless specifically indicated below.  No changes  Medication List:   Medication List       Accurate as of 06/30/16 11:47 AM. Always use your most recent med list.          ADVAIR DISKUS 250-50 MCG/DOSE Aepb Generic drug:  Fluticasone-Salmeterol as needed   ferrous sulfate 325 (65 FE) MG EC tablet Take 325 mg by mouth daily. Reported on 04/13/2016   hydrochlorothiazide 25 MG tablet Commonly known as:  HYDRODIURIL as needed   loratadine-pseudoephedrine 10-240 MG 24 hr tablet Commonly known as:  CLARITIN-D 24-hour Take 1 tablet by mouth daily.   montelukast 10 MG tablet Commonly known as:  SINGULAIR TAKE 1 TABLET (10 MG TOTAL) BY MOUTH AT BEDTIME.   multivitamin tablet Take 1 tablet by mouth every other day. Reported on 04/13/2016   PROAIR HFA 108 (90 Base) MCG/ACT inhaler Generic drug:  albuterol INHALE 2 PUFFS EVERY 4 HOURS AS NEEDED   albuterol (2.5 MG/3ML) 0.083% nebulizer solution Commonly known as:  PROVENTIL Take 3 mLs (2.5 mg total) by nebulization every 6 (six) hours as needed for wheezing or  shortness of breath.   triamcinolone 0.025 % ointment Commonly known as:  KENALOG Apply 1 application topically 2 (two) times daily. For eczema   VITAMIN B 12 PO Take by mouth.       Known medication allergies: Allergies  Allergen Reactions  . Penicillins   . Sulfonamide Derivatives      Physical examination: Blood pressure 138/86, pulse 64, resp. rate 18.  General: Alert, interactive, in no acute distress. HEENT: TMs pearly gray, turbinates mildly edematous without discharge, post-pharynx non erythematous. Neck: Supple without lymphadenopathy. Lungs: Mildly decreased breath sounds with  expiratory wheezing bilaterally. {no increased work of breathing. following her DuoNeb she had decreased amount of wheezing still slightly persistent in the upper lobes bilaterally  CV: Normal S1, S2 without murmurs. Abdomen: Nondistended, nontender. Skin: Several hyperpigmented rough patches about the size of a dime on her posterior arm and on left ankle. Extremities:  No clubbing, cyanosis or edema. Neuro:   Grossly intact.  Diagnositics/Labs:  Spirometry: FEV1: 1.29L 76%, FVC: 1.43L  67%, after DuoNeb her FEV1 increased 14% to 1.47 L or 87%   Assessment and plan: Asthma with exacerbation  - Stop Advair Diskus and start Breo 200  1 inhalation daily --- take this medication every day whether you're feeling well or not  - Continue Singulair 10 mg at night  - Continue albuterol as needed  - We will treat exacerbation with prednisone 40 mg 5 days  Asthma control goals:   Full participation in all desired activities (may need albuterol before activity)  Albuterol use two time or less a week on average (not counting use with activity)  Cough interfering with sleep two time or less a month  Oral steroids no more than once a year  No hospitalizations  Chronic rhinitis - if Claritin is no longer effective consider change to Zyrtec 10 mg or Allegra 180 mg to use as needed for congestion or runny nose  - Use Nasacort 2 sprays each nostril for congestion and runny nose  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 Please do not hesitate to contact me with questions.  Sincerely,   Margo Aye, MD Allergy/Immunology [facility]

## 2016-06-30 NOTE — Patient Instructions (Addendum)
Asthma with exacerbation  - Stop Advair Diskus and start Breo 200  1 inhalation daily --- take this medication every day whether you're feeling well or sick  - Continue Singulair 10 mg at night  - Continue albuterol as needed  - We will treat exacerbation with prednisone 40 mg 5 days  Asthma control goals:   Full participation in all desired activities (may need albuterol before activity)  Albuterol use two time or less a week on average (not counting use with activity)  Cough interfering with sleep two time or less a month  Oral steroids no more than once a year  No hospitalizations  Chronic rhinitis - if Claritin is no longer effective consider change to Zyrtec 10 mg or Allegra 180 mg to use as needed for congestion or runny nose  - Use Nasacort 2 sprays each nostril for congestion and runny nose  Rash - Continue use of triamcinolone twice a day for 5 days  - use antihistamine as above for itch control - consider seeing dermatology for further evaluation   Follow-up 4-6 months

## 2016-08-25 DIAGNOSIS — R5383 Other fatigue: Secondary | ICD-10-CM | POA: Diagnosis not present

## 2016-08-25 DIAGNOSIS — E119 Type 2 diabetes mellitus without complications: Secondary | ICD-10-CM | POA: Diagnosis not present

## 2016-08-25 DIAGNOSIS — E279 Disorder of adrenal gland, unspecified: Secondary | ICD-10-CM | POA: Diagnosis not present

## 2016-08-25 DIAGNOSIS — E641 Sequelae of vitamin A deficiency: Secondary | ICD-10-CM | POA: Diagnosis not present

## 2016-08-25 DIAGNOSIS — N951 Menopausal and female climacteric states: Secondary | ICD-10-CM | POA: Diagnosis not present

## 2016-08-25 DIAGNOSIS — E039 Hypothyroidism, unspecified: Secondary | ICD-10-CM | POA: Diagnosis not present

## 2016-08-25 DIAGNOSIS — E559 Vitamin D deficiency, unspecified: Secondary | ICD-10-CM | POA: Diagnosis not present

## 2016-08-25 DIAGNOSIS — E782 Mixed hyperlipidemia: Secondary | ICD-10-CM | POA: Diagnosis not present

## 2016-08-25 LAB — LIPID PANEL
Cholesterol: 272 mg/dL — AB (ref 0–200)
LDL Cholesterol: 175 mg/dL
Triglycerides: 170 mg/dL — AB (ref 40–160)

## 2016-09-26 DIAGNOSIS — L300 Nummular dermatitis: Secondary | ICD-10-CM | POA: Diagnosis not present

## 2016-10-05 ENCOUNTER — Telehealth: Payer: Self-pay

## 2016-10-05 NOTE — Telephone Encounter (Addendum)
Per Provider - pt will need to obtain Handicap Placard from her PCP due to her not meeting any of the medical criteria for our office.   Clld pt - unable to leave voicemail - per recording voicemail is full.  Please advise patient if she calls back.    Put Handicap Placard form in provider's office for completion.

## 2016-10-20 ENCOUNTER — Telehealth: Payer: Self-pay | Admitting: Family Medicine

## 2016-10-20 NOTE — Telephone Encounter (Signed)
Pt dropped of paper work for disability parking placard on 10/13/16.  [facility], she will need to come in for an office visit in order for papers to be filled out. Please help the pt to make an appt. Thanks!!

## 2016-10-20 NOTE — Telephone Encounter (Signed)
lmom for pt to call back

## 2016-10-20 NOTE — Telephone Encounter (Signed)
° ° ° ° °  Pt call to say that she has already taken care of getting her parking pass paperwork filled out

## 2016-11-28 NOTE — Progress Notes (Signed)
Pre visit review using our clinic review tool, if applicable. No additional management support is needed unless otherwise documented below in the visit note.  Chief Complaint  Patient presents with  . Itchy Ears    Pt is worried that she may have yeast in her ears.  Also worried about her high cholesterol and low iron.  Was not started on any medication.  Would like a referral to Jill Burton at [facility].  . Shortness of Breath    HPI: Jill Burton 71 y.o. come in for SDA   But has a list to address multiple   She has a history of persistent intermittent asthma. She doesn't want to go back to the previous allergist asthma specialist would like a referral to Jill Burton. She prefers Advair and debris. Virgel Bouquet has missed her medicine this morning has had a recent asthma wheeze worse when the weather changes. But she had been doing pretty well until then. She also does better on the pro-air than the Proventil that keeps being refilled she has a hard time with that.  Stop taking her blood pressure medicine a number of months ago because her blood pressure was doing really well and she was attending her lifestyle interventions. Didn't think she needed it.  Has a number of lab tests and the list that she received from [facility] and asks about her cholesterol and should be treated because no one explained to her.  She was also told that her iron was low and injections of iron were suggested but she declined.  She's also seen a homeopathic doctor who told her she had a problem with yeast all over her body including her ears and advised a protocol that had Diflucan a number of other supplements. However she declined to do these things but asks about it.  ROS: See pertinent positives and negatives per HPI. Her ears itch a lot.  Past Medical History:  Diagnosis Date  . Allergic rhinitis    allergy testing neg in 2011  . Anemia   . Asthma   . Hyperlipidemia    no meds  .  Hypertension    does not take bp meds on a regular basis  . SVD (spontaneous vaginal delivery)    x 1    Family History  Problem Relation Age of Onset  . Asthma      Social History   Social History  . Marital status: Married    Spouse name: N/A  . Number of children: N/A  . Years of education: N/A   Social History Main Topics  . Smoking status: Never Smoker  . Smokeless tobacco: Never Used  . Alcohol use No  . Drug use: No  . Sexual activity: Yes    Birth control/ protection: Surgical   Other Topics Concern  . 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      hh of 2        EXAM:  BP (!) 166/92 (BP Location: Right Arm, Patient Position: Sitting, Cuff Size: Large)   Pulse 68   Temp 98.5 F (36.9 C) (Oral)   Wt 233 lb (105.7 kg)   SpO2 98%   BMI 42.96 kg/m   Body mass index is 42.96 kg/m.  GENERAL: vitals reviewed and listed above, alert, oriented, appears well hydrated and in no acute distress HEENT:  atraumatic, conjunctiva  clear, no obvious abnormalities on inspection of external nose and ears TMs appear clear EACs show some flaky dermatitis but no exudate.  NECK: no obvious masses on inspection palpation  LUNGS: Chest diffuse moderately tight wheezing after albuterol nebulizer increased air movement less wheezing. No rales are noted. CV: HRRR, no clubbing cyanosis or  peripheral edema nl cap refill  MS: moves all extremities without noticeable focal  abnormality PSYCH: pleasant and cooperative, no obvious depression or anxiety Lab Results  Component Value Date   WBC 8.1 04/13/2016   HGB 14.2 04/13/2016   HCT 43.2 04/13/2016   PLT 221.0 04/13/2016   GLUCOSE 85 04/13/2016   CHOL 272 (H) 04/13/2016   TRIG 100.0 04/13/2016   HDL 67.70 04/13/2016   LDLDIRECT 157.4 09/23/2013   LDLCALC 184 (H) 04/13/2016   ALT 22 04/13/2016   AST 20 04/13/2016   NA 141 04/13/2016   K  4.0 04/13/2016   CL 104 04/13/2016   CREATININE 0.76 04/13/2016   BUN 10 04/13/2016   CO2 28 04/13/2016   TSH 0.97 04/13/2016   HGBA1C 6.1 04/13/2016   BP Readings from Last 3 Encounters:  11/29/16 (!) 166/92  06/30/16 138/86  04/13/16 (!) 146/80   Wt Readings from Last 3 Encounters:  11/29/16 233 lb (105.7 kg)  04/13/16 229 lb 12.8 oz (104.2 kg)  11/12/15 227 lb 14.4 oz (103.4 kg)     ASSESSMENT AND PLAN:  Discussed the following assessment and plan:  Asthma, chronic, unspecified asthma severity, uncomplicated - Plan: albuterol (PROVENTIL) (2.5 MG/3ML) 0.083% nebulizer solution 2.5 mg, Lipid panel, CBC with Differential/Platelet, Basic metabolic panel, Ferritin, IBC panel, Ambulatory referral to Allergy  Essential hypertension - Plan: Lipid panel, CBC with Differential/Platelet, Basic metabolic panel, Ferritin, IBC panel  Hyperlipidemia, unspecified hyperlipidemia type - Plan: Lipid panel, CBC with Differential/Platelet, Basic metabolic panel, Ferritin, IBC panel  Low iron stores ? by report  - Plan: Lipid panel, CBC with Differential/Platelet, Basic metabolic panel, Ferritin, IBC panel  Ear itching  Exacerbation of asthma, unspecified asthma severity, unspecified whether persistent Asthma ongoing has preferences for Advil and different inhalers uncertain if this fully controlled she is on Singulair gave her a prescription for prednisone in case it is needed for this many flare. For that she be better controlled but she is doing better than before. Reviewed some of the numbers of lab tests that she shown from the other physician. Certainly her LDL is very high  as well as her markers. Her LDL is 175 total 275 HDL 65 triglycerides 170. Her LDL pattern is the high risk or particle number is 1755 high risk. Her apical lipoprotein B is 129 which is high as well as hs  CRP which is 3.4. Lipoprotein little 141 I do not have a copy of her iron studies.  I advised she come back for  fasting blood work although she had a concern of not taking too much blood but we can repeat her lipid panel chemistry blood sugar and iron levels. Brought up the idea of adding a statin medicine currently going to be working with nutrition to help with her cholesterol. Reviewed again the purpose of blood pressure medication that hypertensive medicine does not cure but treats to prevent cardiovascular deleterious outcomes. Hopefully she understood at this time refill her amlodipine 5 mg and she should take it. Are OV in 3 months to assess all of this If her lipids come back high again I suggest she go on statin  medicine.  Discussed lack of credible evidence about systemic Candida infection in her situation she could have some yeast or eczema in the outer ear canal which she can treat topically if needed. I agree with her not adding systemic Diflucan for this reason.  Total visit 40 mins > 50% spent counseling and coordinating care as indicated in above note and in instructions to patient .   prolonged visit today to address all the issues -Patient advised to return or notify health care team  if  new concerns arise.  Patient Instructions  We'll do the referral for asthma therapy to Dr. Gary FleetWhalen. Make sure you're taking her medicines regularly. If your asthma is flaring you can take 3 days of prednisone. Your blood pressure is too high sometimes is high because of asthma attack however you should maintain the blood pressure medication and lifestyle intervention to control the blood pressure. If your blood pressures good and you go off the medicine it may raise back up. Blood pressure medicines do not cure hypertension and control it to help protect your kidneys and your heart. Your cholesterol levels last fall were very high and your inflammation markers were high which shows that your cholesterol which you at risk for heart attack and stroke also. If your cholesterol remains high I would recommend to  add medication  such as atorvastatin or  Rosuvastatin to take daily  in addition to your diet changes. I think he would have more benefit than risk of the medication.  I advised blood work that includes a fasting cholesterol panel chemistry with sugar in it and iron levels because you reported they were low at some point. Sometimes people get too much iron city that she be checked. If all is okay do not need frequent blood drawing. Acting you restart the blood pressure medicine please come back in 3 months and we can talk about continuing or adjusting medicine as appropriate.  I do not think you have a systemic yeast infection I think your ears are itchy from eczema and dry wax and using coconut oil is okay... you can also try a little bit a   otc monistat  minmal amount  If needed.           Jill MendsWanda K. Burton M.D. Allergies as of 11/29/2016      Reactions   Penicillins    Sulfonamide Derivatives       Medication List       Accurate as of 11/29/16  6:53 PM. Always use your most recent med list.          ADVAIR DISKUS 250-50 MCG/DOSE Aepb Generic drug:  Fluticasone-Salmeterol as needed   amLODipine 5 MG tablet Commonly known as:  NORVASC Take 1 tablet (5 mg total) by mouth daily. For hypertension control   hydrochlorothiazide 25 MG tablet Commonly known as:  HYDRODIURIL as needed   loratadine-pseudoephedrine 10-240 MG 24 hr tablet Commonly known as:  CLARITIN-D 24-hour Take 1 tablet by mouth daily.   montelukast 10 MG tablet Commonly known as:  SINGULAIR TAKE 1 TABLET (10 MG TOTAL) BY MOUTH AT BEDTIME.   predniSONE 20 MG tablet Commonly known as:  DELTASONE Take 1 tablet (20 mg total) by mouth 2 (two) times daily with a meal. For asthma flare   PROAIR HFA 108 (90 Base) MCG/ACT inhaler Generic drug:  albuterol INHALE 2 PUFFS EVERY 4 HOURS AS NEEDED   VITAMIN B 12 PO Take by mouth.

## 2016-11-29 ENCOUNTER — Encounter: Payer: Self-pay | Admitting: Internal Medicine

## 2016-11-29 ENCOUNTER — Ambulatory Visit (INDEPENDENT_AMBULATORY_CARE_PROVIDER_SITE_OTHER): Payer: Medicare HMO | Admitting: Internal Medicine

## 2016-11-29 VITALS — BP 166/92 | HR 68 | Temp 98.5°F | Wt 233.0 lb

## 2016-11-29 DIAGNOSIS — I10 Essential (primary) hypertension: Secondary | ICD-10-CM | POA: Diagnosis not present

## 2016-11-29 DIAGNOSIS — E785 Hyperlipidemia, unspecified: Secondary | ICD-10-CM | POA: Diagnosis not present

## 2016-11-29 DIAGNOSIS — L299 Pruritus, unspecified: Secondary | ICD-10-CM

## 2016-11-29 DIAGNOSIS — J45909 Unspecified asthma, uncomplicated: Secondary | ICD-10-CM

## 2016-11-29 DIAGNOSIS — J45901 Unspecified asthma with (acute) exacerbation: Secondary | ICD-10-CM

## 2016-11-29 DIAGNOSIS — R790 Abnormal level of blood mineral: Secondary | ICD-10-CM | POA: Diagnosis not present

## 2016-11-29 MED ORDER — AMLODIPINE BESYLATE 5 MG PO TABS: 5.0000 mg | ORAL_TABLET | Freq: Every day | ORAL | 2 refills | Status: DC

## 2016-11-29 MED ORDER — PREDNISONE 20 MG PO TABS: 20.0000 mg | ORAL_TABLET | Freq: Two times a day (BID) | ORAL | 0 refills | Status: DC

## 2016-11-29 MED ORDER — ALBUTEROL SULFATE (2.5 MG/3ML) 0.083% IN NEBU
2.5000 mg | INHALATION_SOLUTION | Freq: Once | RESPIRATORY_TRACT | Status: AC
  Administered 2016-11-29: 2.5 mg via RESPIRATORY_TRACT

## 2016-11-29 NOTE — Patient Instructions (Addendum)
We'll do the referral for asthma therapy to Dr. Gary FleetWhalen. Make sure you're taking her medicines regularly. If your asthma is flaring you can take 3 days of prednisone. Your blood pressure is too high sometimes is high because of asthma attack however you should maintain the blood pressure medication and lifestyle intervention to control the blood pressure. If your blood pressures good and you go off the medicine it may raise back up. Blood pressure medicines do not cure hypertension and control it to help protect your kidneys and your heart. Your cholesterol levels last fall were very high and your inflammation markers were high which shows that your cholesterol which you at risk for heart attack and stroke also. If your cholesterol remains high I would recommend to add medication  such as atorvastatin or  Rosuvastatin to take daily  in addition to your diet changes. I think he would have more benefit than risk of the medication.  I advised blood work that includes a fasting cholesterol panel chemistry with sugar in it and iron levels because you reported they were low at some point. Sometimes people get too much iron city that she be checked. If all is okay do not need frequent blood drawing. Acting you restart the blood pressure medicine please come back in 3 months and we can talk about continuing or adjusting medicine as appropriate.  I do not think you have a systemic yeast infection I think your ears are itchy from eczema and dry wax and using coconut oil is okay... you can also try a little bit a   otc monistat  minmal amount  If needed.

## 2016-12-02 MED ORDER — ALBUTEROL SULFATE (2.5 MG/3ML) 0.083% IN NEBU: 2.5000 mg | INHALATION_SOLUTION | Freq: Once | RESPIRATORY_TRACT | Status: DC

## 2016-12-02 NOTE — Addendum Note (Signed)
Addended by: Raj JanusADKINS, Demira Gwynne T on: 12/02/2016 01:20 PM   Modules accepted: Orders

## 2016-12-09 ENCOUNTER — Encounter: Payer: Self-pay | Admitting: Family Medicine

## 2016-12-13 ENCOUNTER — Other Ambulatory Visit (INDEPENDENT_AMBULATORY_CARE_PROVIDER_SITE_OTHER): Payer: Medicare HMO

## 2016-12-13 DIAGNOSIS — E785 Hyperlipidemia, unspecified: Secondary | ICD-10-CM

## 2016-12-13 DIAGNOSIS — J45909 Unspecified asthma, uncomplicated: Secondary | ICD-10-CM | POA: Diagnosis not present

## 2016-12-13 DIAGNOSIS — R790 Abnormal level of blood mineral: Secondary | ICD-10-CM | POA: Diagnosis not present

## 2016-12-13 DIAGNOSIS — I10 Essential (primary) hypertension: Secondary | ICD-10-CM

## 2016-12-13 LAB — CBC WITH DIFFERENTIAL/PLATELET
Basophils Absolute: 0 10*3/uL (ref 0.0–0.1)
Basophils Relative: 0.7 % (ref 0.0–3.0)
Eosinophils Absolute: 0.2 10*3/uL (ref 0.0–0.7)
Eosinophils Relative: 2.9 % (ref 0.0–5.0)
HCT: 44.5 % (ref 36.0–46.0)
Hemoglobin: 14.6 g/dL (ref 12.0–15.0)
Lymphocytes Relative: 29.5 % (ref 12.0–46.0)
Lymphs Abs: 2.1 10*3/uL (ref 0.7–4.0)
MCHC: 32.9 g/dL (ref 30.0–36.0)
MCV: 88.1 fl (ref 78.0–100.0)
Monocytes Absolute: 0.5 10*3/uL (ref 0.1–1.0)
Monocytes Relative: 7.3 % (ref 3.0–12.0)
Neutro Abs: 4.2 10*3/uL (ref 1.4–7.7)
Neutrophils Relative %: 59.6 % (ref 43.0–77.0)
Platelets: 212 10*3/uL (ref 150.0–400.0)
RBC: 5.05 Mil/uL (ref 3.87–5.11)
RDW: 14 % (ref 11.5–15.5)
WBC: 7 10*3/uL (ref 4.0–10.5)

## 2016-12-13 LAB — LIPID PANEL
Cholesterol: 244 mg/dL — ABNORMAL HIGH (ref 0–200)
HDL: 63 mg/dL (ref >39.00)
LDL Cholesterol: 151 mg/dL — ABNORMAL HIGH (ref 0–99)
NonHDL: 181.28
Total CHOL/HDL Ratio: 4
Triglycerides: 153 mg/dL — ABNORMAL HIGH (ref 0.0–149.0)
VLDL: 30.6 mg/dL (ref 0.0–40.0)

## 2016-12-13 LAB — BASIC METABOLIC PANEL
BUN: 10 mg/dL (ref 6–23)
CO2: 28 mEq/L (ref 19–32)
Calcium: 9.2 mg/dL (ref 8.4–10.5)
Chloride: 104 mEq/L (ref 96–112)
Creatinine, Ser: 0.71 mg/dL (ref 0.40–1.20)
GFR: 104.8 mL/min (ref >60.00)
Glucose, Bld: 91 mg/dL (ref 70–99)
Potassium: 4.3 mEq/L (ref 3.5–5.1)
Sodium: 141 mEq/L (ref 135–145)

## 2016-12-13 LAB — IBC PANEL
Iron: 76 ug/dL (ref 42–145)
Saturation Ratios: 19.7 % — ABNORMAL LOW (ref 20.0–50.0)
Transferrin: 276 mg/dL (ref 212.0–360.0)

## 2016-12-13 LAB — FERRITIN: Ferritin: 38.3 ng/mL (ref 10.0–291.0)

## 2016-12-27 DIAGNOSIS — J300 Vasomotor rhinitis: Secondary | ICD-10-CM | POA: Diagnosis not present

## 2016-12-27 DIAGNOSIS — L209 Atopic dermatitis, unspecified: Secondary | ICD-10-CM | POA: Diagnosis not present

## 2016-12-27 DIAGNOSIS — J4540 Moderate persistent asthma, uncomplicated: Secondary | ICD-10-CM | POA: Diagnosis not present

## 2017-01-29 ENCOUNTER — Other Ambulatory Visit: Payer: Self-pay | Admitting: Internal Medicine

## 2017-01-30 NOTE — Telephone Encounter (Signed)
Last refilled 10/13/2013. Pt would like a refill. Please advise.

## 2017-01-30 NOTE — Telephone Encounter (Signed)
I advised that your new allergist Dr. Grayling Congress refills this prescription.  If you're going to run out before your appointment we can send in one refill for this future refills to be done by your allergist.

## 2017-02-02 ENCOUNTER — Other Ambulatory Visit: Payer: Self-pay | Admitting: Emergency Medicine

## 2017-02-02 MED ORDER — FLUTICASONE-SALMETEROL 250-50 MCG/DOSE IN AEPB: INHALATION_SPRAY | RESPIRATORY_TRACT | 0 refills | Status: DC

## 2017-02-09 DIAGNOSIS — L209 Atopic dermatitis, unspecified: Secondary | ICD-10-CM | POA: Diagnosis not present

## 2017-02-09 DIAGNOSIS — J4540 Moderate persistent asthma, uncomplicated: Secondary | ICD-10-CM | POA: Diagnosis not present

## 2017-02-09 DIAGNOSIS — J300 Vasomotor rhinitis: Secondary | ICD-10-CM | POA: Diagnosis not present

## 2017-02-09 DIAGNOSIS — J209 Acute bronchitis, unspecified: Secondary | ICD-10-CM | POA: Diagnosis not present

## 2017-02-12 ENCOUNTER — Other Ambulatory Visit: Payer: Self-pay | Admitting: Adult Health

## 2017-02-12 DIAGNOSIS — J4521 Mild intermittent asthma with (acute) exacerbation: Secondary | ICD-10-CM

## 2017-02-13 ENCOUNTER — Other Ambulatory Visit: Payer: Self-pay | Admitting: Internal Medicine

## 2017-04-13 DIAGNOSIS — S46911A Strain of unspecified muscle, fascia and tendon at shoulder and upper arm level, right arm, initial encounter: Secondary | ICD-10-CM | POA: Diagnosis not present

## 2017-05-12 NOTE — Progress Notes (Signed)
Chief Complaint  Patient presents with  . Acute Visit    vitamin  D level     HPI:  Jill Burton 71 y.o.   A lade with asthma   hld  HT  And laternative medicine  Who comes in today to see if she should have her vitamin D level checked because she has a right shoulder discomfort is lasting longer than she should day. Her alternative medicine doctor would give her vitamin D shots about every 3 months for musculoskeletal complaints I believe.  A few months ago he thinks she hurt her strained her shoulder with the suitcase. He never got totally better and is problematic when she sleeps laying on the right side.  She thinks that her shoulder was not properly treated and therefore still bothering her.   And had heavy suitcase and went to ed  And still there  And homeopathic   Had vit d shots.   Is out.   Taking 5000 iu per day  Vit d  just restarted   Not taking bp med cause her bp has been better    Asthma stable per report .   Lipids ee last notes  Agreeable to taking  Med for cholesterol her husband has been on med for lipids .   ROS: See pertinent positives and negatives per HPI.  Past Medical History:  Diagnosis Date  . Allergic rhinitis    allergy testing neg in 2011  . Anemia   . Asthma   . Hyperlipidemia    no meds  . Hypertension    does not take bp meds on a regular basis  . SVD (spontaneous vaginal delivery)    x 1    Family History  Problem Relation Age of Onset  . Asthma Unknown     Social History   Social History  . Marital status: Married    Spouse name: N/A  . Number of children: N/A  . Years of education: N/A   Social History Main Topics  . Smoking status: Never Smoker  . Smokeless tobacco: Never Used  . Alcohol use No  . Drug use: No  . Sexual activity: Yes    Birth control/ protection: Surgical   Other Topics Concern  . 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      hh of 2     Outpatient Medications Prior to Visit  Medication Sig Dispense Refill  . albuterol (PROVENTIL) (2.5 MG/3ML) 0.083% nebulizer solution INHALE CONTENTS OF 1 VIAL VIA NEBULIZER EVERY 6 HOURS AS NEEDED FOR WHEEZING OR SHORTNESS OF BREATH 75 mL 0  . amLODipine (NORVASC) 5 MG tablet Take 1 tablet (5 mg total) by mouth daily. For hypertension control 90 tablet 2  . Cyanocobalamin (VITAMIN B 12 PO) Take by mouth.    . Fluticasone-Salmeterol (ADVAIR DISKUS) 250-50 MCG/DOSE AEPB USE 1 PUFF AS NEEDED 60 each 0  . hydrochlorothiazide (HYDRODIURIL) 25 MG tablet as needed    . loratadine-pseudoephedrine (CLARITIN-D 24-HOUR) 10-240 MG 24 hr tablet Take 1 tablet by mouth daily.    . montelukast (SINGULAIR) 10 MG tablet TAKE 1 TABLET (10 MG TOTAL) BY MOUTH AT BEDTIME. 30 tablet 5  . PROAIR HFA 108 (90 BASE) MCG/ACT inhaler INHALE 2 PUFFS EVERY 4 HOURS AS NEEDED 8.5 Inhaler 0  . predniSONE (DELTASONE) 20 MG tablet Take 1 tablet (  20 mg total) by mouth 2 (two) times daily with a meal. For asthma flare 10 tablet 0   Facility-Administered Medications Prior to Visit  Medication Dose Route Frequency Provider Last Rate Last Dose  . albuterol (PROVENTIL) (2.5 MG/3ML) 0.083% nebulizer solution 2.5 mg  2.5 mg Nebulization Once Leng Montesdeoca, Neta Mends, MD         EXAM:  BP 118/70 (BP Location: Left Arm, Patient Position: Sitting, Cuff Size: Large)   Pulse 73   Temp 98.2 F (36.8 C) (Oral)   Wt 231 lb 6.4 oz (105 kg)   BMI 42.67 kg/m   Body mass index is 42.67 kg/m.  GENERAL: vitals reviewed and listed above, alert, oriented, appears well hydrated and in no acute distress HEENT: atraumatic, conjunctiva  clear, no obvious abnormalities on inspection of external nose and ears  NECK: no obvious masses on inspection palpation   CV: HRRR, no clubbing cyanosis or  peripheral edema nl cap refill  MS: moves all extremities   Goo d rom  Right shoulder but tender at deltoid area and rotation    But without noticeable focal  abnormality PSYCH: pleasant and cooperative, no obvious depression or anxiety BP Readings from Last 3 Encounters:  05/15/17 118/70  11/29/16 (!) 166/92  06/30/16 138/86   Wt Readings from Last 3 Encounters:  05/15/17 231 lb 6.4 oz (105 kg)  11/29/16 233 lb (105.7 kg)  04/13/16 229 lb 12.8 oz (104.2 kg)   Lab Results  Component Value Date   WBC 7.0 12/13/2016   HGB 14.6 12/13/2016   HCT 44.5 12/13/2016   PLT 212.0 12/13/2016   GLUCOSE 91 12/13/2016   CHOL 244 (H) 12/13/2016   TRIG 153.0 (H) 12/13/2016   HDL 63.00 12/13/2016   LDLDIRECT 157.4 09/23/2013   LDLCALC 151 (H) 12/13/2016   ALT 22 04/13/2016   AST 20 04/13/2016   NA 141 12/13/2016   K 4.3 12/13/2016   CL 104 12/13/2016   CREATININE 0.71 12/13/2016   BUN 10 12/13/2016   CO2 28 12/13/2016   TSH 0.97 04/13/2016   HGBA1C 6.1 04/13/2016   .revewied   Lab from [place]  The 10-year ASCVD risk score Denman George DC Montez Hageman., et al., 2013) is: 11.6%   Values used to calculate the score:     Age: 57 years     Sex: Female     Is Non-Hispanic African American: Yes     Diabetic: No     Tobacco smoker: No     Systolic Blood Pressure: 118 mmHg     Is BP treated: Yes     HDL Cholesterol: 63 mg/dL     Total Cholesterol: 244 mg/dL  ASSESSMENT AND PLAN:  Discussed the following assessment and plan:  Right shoulder pain, unspecified chronicity  Hyperlipidemia, unspecified hyperlipidemia type - begin low dose med trial - Plan: Lipid panel  Vitamin D deficiency, unspecified - presumed  cautioned to avoid vit d toxicity  - Plan: VITAMIN D 25 Hydroxy (Vit-D Deficiency, Fractures)  Essential hypertension - reported at goal off med follow  Medication management - Plan: Lipid panel, VITAMIN D 25 Hydroxy (Vit-D Deficiency, Fractures)  Alternative medicine - homeopathy and other  Presumed low vitamin D level although alternative therapy discussed as not within the realm of database medication getting  vitamin D shots. She can take her vitamin D daily but do not increase it and can check level in about 3 months. Local care for her shoulder anti-inflammatory short-term prefers no injection consideration of  sports medicine evaluation of ongoing. Glad her blood pressures better today off of medicine but discussed importance of control for her future health Your regards to her lipids she is amenable to taking medication and see how it works we'll check lab in 3 months time once to go on low dose we'll give atorvastatin  10 mg -Patient advised to return or notify health care team  if symptoms worsen ,persist or new concerns arise. Total visit 67mns > 50% spent counseling and coordinating care as indicated in above note and in instructions to patient .    Patient Instructions   Take anti-inflammatory for 1-2 weeks as we discussed. Avoid overhead lifting and rotation. But okay to use range of motion. I think you had a shoulder strain and possibly bursitis. If not improved in the next 3-4 weeks contact [place] follow up and get sports medicine clinician to see you. Take your hives thousand units of vitamin D shots of vitamin D or not standard of care in would be concerned if you got too much vitamin D. We can check a level in 3 months. In regard to your cholesterol I agree he may benefit from a cholesterol reducing medication in addition to healthy eating lifestyle and some modest weight loss. Begin medicine take daily we'll check lipid panel in about 3 months on medicine. Make sure bp goal  Is met.    120/80 is best. But ok to be  130/80   135/85 is now considered  HT.   Lab Results  Component Value Date   WBC 7.0 12/13/2016   HGB 14.6 12/13/2016   HCT 44.5 12/13/2016   PLT 212.0 12/13/2016   GLUCOSE 91 12/13/2016   CHOL 244 (H) 12/13/2016   TRIG 153.0 (H) 12/13/2016   HDL 63.00 12/13/2016   LDLDIRECT 157.4 09/23/2013   LDLCALC 151 (H) 12/13/2016   ALT 22 04/13/2016   AST 20 04/13/2016   NA  141 12/13/2016   K 4.3 12/13/2016   CL 104 12/13/2016   CREATININE 0.71 12/13/2016   BUN 10 12/13/2016   CO2 28 12/13/2016   TSH 0.97 04/13/2016   HGBA1C 6.1 04/13/2016    Shoulder Pain Many things can cause shoulder pain, including:  An injury to the area.  Overuse of the shoulder.  Arthritis.  The source of the pain can be:  Inflammation.  An injury to the shoulder joint.  An injury to a tendon, ligament, or bone.  Follow these instructions at home: Take these actions to help with your pain:  Squeeze a soft ball or a foam pad as much as possible. This helps to keep the shoulder from swelling. It also helps to strengthen the arm.  Take over-the-counter and prescription medicines only as told by your health care provider.  If directed, apply ice to the area: ? Put ice in a plastic bag. ? Place a towel between your skin and the bag. ? Leave the ice on for 20 minutes, 2-3 times per day. Stop applying ice if it does not help with the pain.  If you were given a shoulder sling or immobilizer: ? Wear it as told. ? Remove it to shower or bathe. ? Move your arm as little as possible, but keep your hand moving to prevent swelling.  Contact a health care provider if:  Your pain gets worse.  Your pain is not relieved with medicines.  New pain develops in your arm, hand, or fingers. Get help right away if:  Your  arm, hand, or fingers: ? Tingle. ? Become numb. ? Become swollen. ? Become painful. ? Turn white or blue. This information is not intended to replace advice given to you by your health care provider. Make sure you discuss any questions you have with your health care provider. Document Released: 07/13/2005 Document Revised: 05/29/2016 Document Reviewed: 01/26/2015 Elsevier Interactive Patient Education  2017 [place]. Tikesha Mort M.D.

## 2017-05-15 ENCOUNTER — Encounter: Payer: Self-pay | Admitting: Internal Medicine

## 2017-05-15 ENCOUNTER — Ambulatory Visit (INDEPENDENT_AMBULATORY_CARE_PROVIDER_SITE_OTHER): Payer: Medicare HMO | Admitting: Internal Medicine

## 2017-05-15 VITALS — BP 118/70 | HR 73 | Temp 98.2°F | Wt 231.4 lb

## 2017-05-15 DIAGNOSIS — M25511 Pain in right shoulder: Secondary | ICD-10-CM

## 2017-05-15 DIAGNOSIS — Z5189 Encounter for other specified aftercare: Secondary | ICD-10-CM

## 2017-05-15 DIAGNOSIS — E785 Hyperlipidemia, unspecified: Secondary | ICD-10-CM | POA: Diagnosis not present

## 2017-05-15 DIAGNOSIS — E559 Vitamin D deficiency, unspecified: Secondary | ICD-10-CM | POA: Diagnosis not present

## 2017-05-15 DIAGNOSIS — I10 Essential (primary) hypertension: Secondary | ICD-10-CM | POA: Diagnosis not present

## 2017-05-15 DIAGNOSIS — Z79899 Other long term (current) drug therapy: Secondary | ICD-10-CM | POA: Diagnosis not present

## 2017-05-15 MED ORDER — ATORVASTATIN CALCIUM 10 MG PO TABS: 10.0000 mg | ORAL_TABLET | Freq: Every day | ORAL | 3 refills | Status: DC

## 2017-05-15 MED ORDER — MELOXICAM 7.5 MG PO TABS: 7.5000 mg | ORAL_TABLET | Freq: Every day | ORAL | 0 refills | Status: DC

## 2017-05-15 NOTE — Patient Instructions (Addendum)
Take anti-inflammatory for 1-2 weeks as we discussed. Avoid overhead lifting and rotation. But okay to use range of motion. I think you had a shoulder strain and possibly bursitis. If not improved in the next 3-4 weeks contact us or follow up and get sports medicine clinician to see you. Take your hives thousand units of vitamin D shots of vitamin D or not standard of care in would be concerned if you got too much vitamin D. We can check a level in 3 months. In regard to your cholesterol I agree he may benefit from a cholesterol reducing medication in addition to healthy eating lifestyle and some modest weight loss. Begin medicine take daily we'll check lipid panel in about 3 months on medicine. Make sure bp goal  Is met.    120/80 is best. But ok to be  130/80   135/85 is now considered  HT.   Lab Results  Component Value Date   WBC 7.0 12/13/2016   HGB 14.6 12/13/2016   HCT 44.5 12/13/2016   PLT 212.0 12/13/2016   GLUCOSE 91 12/13/2016   CHOL 244 (H) 12/13/2016   TRIG 153.0 (H) 12/13/2016   HDL 63.00 12/13/2016   LDLDIRECT 157.4 09/23/2013   LDLCALC 151 (H) 12/13/2016   ALT 22 04/13/2016   AST 20 04/13/2016   NA 141 12/13/2016   K 4.3 12/13/2016   CL 104 12/13/2016   CREATININE 0.71 12/13/2016   BUN 10 12/13/2016   CO2 28 12/13/2016   TSH 0.97 04/13/2016   HGBA1C 6.1 04/13/2016    Shoulder Pain Many things can cause shoulder pain, including:  An injury to the area.  Overuse of the shoulder.  Arthritis.  The source of the pain can be:  Inflammation.  An injury to the shoulder joint.  An injury to a tendon, ligament, or bone.  Follow these instructions at home: Take these actions to help with your pain:  Squeeze a soft ball or a foam pad as much as possible. This helps to keep the shoulder from swelling. It also helps to strengthen the arm.  Take over-the-counter and prescription medicines only as told by your health care provider.  If directed, apply ice to the  area: ? Put ice in a plastic bag. ? Place a towel between your skin and the bag. ? Leave the ice on for 20 minutes, 2-3 times per day. Stop applying ice if it does not help with the pain.  If you were given a shoulder sling or immobilizer: ? Wear it as told. ? Remove it to shower or bathe. ? Move your arm as little as possible, but keep your hand moving to prevent swelling.  Contact a health care provider if:  Your pain gets worse.  Your pain is not relieved with medicines.  New pain develops in your arm, hand, or fingers. Get help right away if:  Your arm, hand, or fingers: ? Tingle. ? Become numb. ? Become swollen. ? Become painful. ? Turn white or blue. This information is not intended to replace advice given to you by your health care provider. Make sure you discuss any questions you have with your health care provider. Document Released: 07/13/2005 Document Revised: 05/29/2016 Document Reviewed: 01/26/2015 Elsevier Interactive Patient Education  2017 [employer].

## 2017-07-07 ENCOUNTER — Encounter: Payer: Self-pay | Admitting: Internal Medicine

## 2017-08-08 ENCOUNTER — Encounter: Payer: Self-pay | Admitting: Internal Medicine

## 2017-08-08 ENCOUNTER — Ambulatory Visit (INDEPENDENT_AMBULATORY_CARE_PROVIDER_SITE_OTHER): Payer: Medicare HMO | Admitting: Internal Medicine

## 2017-08-08 VITALS — BP 136/64 | HR 68 | Wt 231.8 lb

## 2017-08-08 DIAGNOSIS — H01139 Eczematous dermatitis of unspecified eye, unspecified eyelid: Secondary | ICD-10-CM | POA: Diagnosis not present

## 2017-08-08 DIAGNOSIS — R21 Rash and other nonspecific skin eruption: Secondary | ICD-10-CM

## 2017-08-08 MED ORDER — HYDROCORTISONE 2.5 % EX CREA: TOPICAL_CREAM | Freq: Two times a day (BID) | CUTANEOUS | 0 refills | Status: DC

## 2017-08-08 NOTE — Patient Instructions (Addendum)
This acts like   Eyelid dermatitis  And some allergic to something that came in contact with your sensitive eyelid skin.   Less Is best   For  The area  And    I agree  No eye make up until better   Using  Vaseline jelly at night is good for moisturizer  . Do not get in eye.   You can add low dose hcs  ocass no more than    5 days in a row    If flaring and itchy   Ok to  So the same and add  Other rx to the neck area .

## 2017-08-08 NOTE — Progress Notes (Signed)
Chief Complaint  Patient presents with  . Rash    rash on eyelids and chest x 2-3 months off and on. Pt using Vasoline. Pt notes light itching, redness. Red specks on body, "red freckles"    HPI: Jill Burton 71 y.o.  sda because over the last weeks off and on she has had itchy rash over both eyelids not involving her eyeball.  She has used some moisturizing eyedrops and no new eye makeup and does not use a lot of makeup.  She has used some Vaseline at night which has helped at times and the symptoms wax and wane. She also has some blotchy itchy rash on her anterior chest and neck area.  She had been given a cream or prescription for a problem rash on her legs. She is known to be allergic. Also asks about little red freckles on her body but no bruising or bleeding.  ROS: See pertinent positives and negatives per HPI.  Past Medical History:  Diagnosis Date  . Allergic rhinitis    allergy testing neg in 2011  . Anemia   . Asthma   . Hyperlipidemia    no meds  . Hypertension    does not take bp meds on a regular basis  . SVD (spontaneous vaginal delivery)    x 1    Family History  Problem Relation Age of Onset  . Asthma Unknown     Social History   Social History  . Marital status: Married    Spouse name: N/A  . Number of children: N/A  . Years of education: N/A   Social History Main Topics  . Smoking status: Never Smoker  . Smokeless tobacco: Never Used  . Alcohol use No  . Drug use: No  . Sexual activity: Yes    Birth control/ protection: Surgical   Other Topics Concern  . 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      hh of 2     Outpatient Medications Prior to Visit  Medication Sig Dispense Refill  . albuterol (PROVENTIL) (2.5 MG/3ML) 0.083% nebulizer solution INHALE CONTENTS OF 1 VIAL VIA NEBULIZER EVERY 6 HOURS AS NEEDED FOR WHEEZING OR SHORTNESS OF BREATH 75 mL 0   . amLODipine (NORVASC) 5 MG tablet Take 1 tablet (5 mg total) by mouth daily. For hypertension control 90 tablet 2  . Cyanocobalamin (VITAMIN B 12 PO) Take by mouth.    . Fluticasone-Salmeterol (ADVAIR DISKUS) 250-50 MCG/DOSE AEPB USE 1 PUFF AS NEEDED 60 each 0  . hydrochlorothiazide (HYDRODIURIL) 25 MG tablet as needed    . loratadine-pseudoephedrine (CLARITIN-D 24-HOUR) 10-240 MG 24 hr tablet Take 1 tablet by mouth daily.    . montelukast (SINGULAIR) 10 MG tablet TAKE 1 TABLET (10 MG TOTAL) BY MOUTH AT BEDTIME. 30 tablet 5  . PROAIR HFA 108 (90 BASE) MCG/ACT inhaler INHALE 2 PUFFS EVERY 4 HOURS AS NEEDED 8.5 Inhaler 0  . atorvastatin (LIPITOR) 10 MG tablet Take 1 tablet (10 mg total) by mouth daily. (Patient not taking: Reported on 08/08/2017) 30 tablet 3  . meloxicam (MOBIC) 7.5 MG tablet Take 1 tablet (7.5 mg total) by mouth daily. For 10 - 14 days and then as needed for shoulder pain (Patient not taking: Reported on 08/08/2017) 30 tablet 0   Facility-Administered Medications Prior to Visit  Medication Dose Route Frequency Provider Last  Rate Last Dose  . albuterol (PROVENTIL) (2.5 MG/3ML) 0.083% nebulizer solution 2.5 mg  2.5 mg Nebulization Once Panosh, Neta Mends, MD         EXAM:  BP 136/64 (BP Location: Left Wrist, Patient Position: Sitting, Cuff Size: Normal)   Pulse 68   Wt 231 lb 12.8 oz (105.1 kg)   BMI 42.74 kg/m   Body mass index is 42.74 kg/m.  GENERAL: vitals reviewed and listed above, alert, oriented, appears well hydrated and in no acute distress HEENT: atraumatic, conjunctiva  clear, no obvious abnormalities on inspection of external nose and ears   she has some symmetrical mild dermatitis over both upper eyelids.  Lashes are clear as well as the conjunctiva and there is no discharge.  OP : no lesion edema or exudate  NECK: no obvious masses on inspection palpation  Skin: There is some blotchy flat eczema-like lesions red on the right upper chest near the neck.  There  are no vesicles or significant scaling. There is some scattered very tiny pinpoint what look like cherry angiomas on her forearms.  No petechiae BP Readings from Last 3 Encounters:  08/08/17 136/64  05/15/17 118/70  11/29/16 (!) 166/92   Wt Readings from Last 3 Encounters:  08/08/17 231 lb 12.8 oz (105.1 kg)  05/15/17 231 lb 6.4 oz (105 kg)  11/29/16 233 lb (105.7 kg)    ASSESSMENT AND PLAN:  Discussed the following assessment and plan:  Eczematous dermatitis of eyelid, unspecified laterality - vs   contact   Rash Suspect eyelid dermatitis eczematous with only skin involved. I agree the less is best to avoid all makeup if needed can use low-dose hydrocortisone 2.5 minimal amounts and when better can add back hypoallergenic topicals.  If an ongoing problem consider seeing dermatology or eye doctor for further advice.  Okay to use the Vaseline and the eyedrops.  Reassurance about the other dots that look like benign lesions. -Patient advised to return or notify health care team  if symptoms worsen ,persist or new concerns arise.  Patient Instructions  This acts like   Eyelid dermatitis  And some allergic to something that came in contact with your sensitive eyelid skin.   Less Is best   For  The area  And    I agree  No eye make up until better   Using  Vaseline jelly at night is good for moisturizer  . Do not get in eye.   You can add low dose hcs  ocass no more than    5 days in a row    If flaring and itchy   Ok to  So the same and add  Other rx to the neck area .          Neta Mends. Panosh M.D.

## 2017-08-14 ENCOUNTER — Other Ambulatory Visit: Payer: Medicare HMO

## 2017-09-04 ENCOUNTER — Telehealth: Payer: Self-pay | Admitting: Family Medicine

## 2017-09-04 NOTE — Telephone Encounter (Signed)
Copied from CRM [id number]. Topic: Referral - Request >> Sep 04, 2017  2:11 PM [name], [name] wrote:   Pt [facility] to [facility] nutritionist , She said Dr Fabian SharpPanosh had requested that she see a nutritionist

## 2017-09-05 NOTE — Telephone Encounter (Signed)
Please advise Dr Panosh, thanks.   

## 2017-09-06 NOTE — Telephone Encounter (Signed)
Ok to do this  Dx  hld,  hypertension  Weight gain

## 2017-09-11 MED ORDER — HYDROCHLOROTHIAZIDE 25 MG PO TABS: 25.0000 mg | ORAL_TABLET | ORAL | 1 refills | Status: DC | PRN

## 2017-09-11 NOTE — Telephone Encounter (Signed)
Copied from CRM #[id number]. Topic: Referral - Request >> Sep 04, 2017  2:11 PM Kennedy, [name] wrote:   Pt req referral to [facility] to see a nutritionist , She said Dr Panosh had requested that she see a nutritionist   >> Sep 11, 2017  9:44 AM Payne, Angela L wrote: Patient just calling back to f/u on referral 

## 2017-09-11 NOTE — Telephone Encounter (Signed)
Copied from CRM [id number]. Topic: Referral - Request >> Sep 04, 2017  2:11 PM [name], [name] wrote:   [facility] , She said Dr Fabian SharpPanosh had requested that she see a nutritionist   >> Sep 11, 2017  9:44 AM Gerrianne ScalePayne, Angela L wrote: Patient just calling back to f/u on referral

## 2017-09-11 NOTE — Telephone Encounter (Signed)
Copied from CRM [id number]. Topic: Quick Communication - See Telephone Encounter >> Sep 11, 2017 10:06 AM [name], [name] wrote: CRM for notification. See Telephone encounter for:  hydrochlorothiazide (HYDRODIURIL) 25 MG tablet   AND SINGULAR    [facility] [address]  11/11/16.

## 2017-09-11 NOTE — Telephone Encounter (Signed)
Medication has been refilled.

## 2017-09-13 ENCOUNTER — Telehealth: Payer: Self-pay | Admitting: Family Medicine

## 2017-09-13 DIAGNOSIS — R635 Abnormal weight gain: Secondary | ICD-10-CM

## 2017-09-13 NOTE — Telephone Encounter (Signed)
Please advise Dr Panosh, thanks.   

## 2017-09-13 NOTE — Telephone Encounter (Signed)
Copied from CRM [id number]. Topic: Referral - Request >> Sep 13, 2017 10:54 AM [name], [name] wrote:    Pt call to follow up on her referral [facility] to see a nutritious, Weight loss clinic  They need a referral sent to the below fax number   [facility]ax number [facility] [phone]

## 2017-09-13 NOTE — Telephone Encounter (Signed)
I have said yes  Already  May have been lost in the system    Please proceed with referral

## 2017-09-13 NOTE — Telephone Encounter (Signed)
Copied from CRM [phone]. Topic: Referral - Request >> Sep 04, 2017  2:11 PM [name], [name] wrote:   Pt req referral to [facility] to see a nutritionist , She said Dr Fabian SharpPanosh had requested that she see a nutritionist   >> Sep 11, 2017  9:44 AM Gerrianne ScalePayne, Angela L wrote: Patient just calling back to f/u on referral >> Sep 13, 2017 10:54 AM [name], [name] wrote:    Pt call to follow up on her referral to [facility] to see a nutritious, Weight loss clinic  They need a referral sent to the below fax number   Fax number [facility] [phone]

## 2017-09-14 NOTE — Telephone Encounter (Signed)
Copied from CRM [id number]. Topic: Referral - Request >> Sep 14, 2017 11:30 AM Crist InfanteHarrald, Kathy J wrote: Pt would like this referral asap. Pt states she has called several times

## 2017-09-14 NOTE — Telephone Encounter (Signed)
Referral has been placed Pt aware that referral is done and someone will be reaching out to her @ [phone].  No call back needed.

## 2017-09-19 DIAGNOSIS — R635 Abnormal weight gain: Secondary | ICD-10-CM | POA: Diagnosis not present

## 2017-09-19 DIAGNOSIS — R5383 Other fatigue: Secondary | ICD-10-CM | POA: Diagnosis not present

## 2017-09-19 DIAGNOSIS — R0602 Shortness of breath: Secondary | ICD-10-CM | POA: Diagnosis not present

## 2017-09-20 ENCOUNTER — Telehealth: Payer: Self-pay | Admitting: Family Medicine

## 2017-09-20 NOTE — Telephone Encounter (Signed)
Copied from CRM [id number]. Topic: Inquiry >> Sep 20, 2017  1:39 PM Crist InfanteHarrald, Kathy J wrote: Reason for CRM: pt states she wants the Vit D injection discussed at last appt with Dr Fabian SharpPanosh.  Advised pt she still needs to schedule the Vit D lab, and pt states she thought she already did this lab. Pt thinks are records are wrong.  Would like Dr Fabian SharpPanosh to advise if OK to proceed with the Vit D injection because her bones are aching.

## 2017-09-20 NOTE — Telephone Encounter (Signed)
Per Dr Fabian SharpPanosh we do not do Vit D shots but the oral medications can be adjusted depending on labs.  Pt needs to come and have her Vit D levels checked.  Pt should be taking Vit D 5000IU daily.  Vit D levels may not be the cause of the bone aches but we will look at the levels  Do Vit D levels and I PTH intact

## 2017-09-21 NOTE — Telephone Encounter (Signed)
Pt aware of rec's per Dr Fabian SharpPanosh  Declined blood work at this time.  Pt states that she is not interested in taking po Vit D bc they are so potent.  Pt advised that to best treat her we need to know what her levels are -- still declined.   Will send to Dr Fabian SharpPanosh as Lorain ChildesFYI.

## 2017-10-24 DIAGNOSIS — G4730 Sleep apnea, unspecified: Secondary | ICD-10-CM | POA: Diagnosis not present

## 2017-10-24 DIAGNOSIS — R0602 Shortness of breath: Secondary | ICD-10-CM | POA: Diagnosis not present

## 2017-10-24 DIAGNOSIS — R635 Abnormal weight gain: Secondary | ICD-10-CM | POA: Diagnosis not present

## 2017-10-24 DIAGNOSIS — R062 Wheezing: Secondary | ICD-10-CM | POA: Diagnosis not present

## 2017-11-24 DIAGNOSIS — R635 Abnormal weight gain: Secondary | ICD-10-CM | POA: Diagnosis not present

## 2017-11-24 DIAGNOSIS — J45901 Unspecified asthma with (acute) exacerbation: Secondary | ICD-10-CM | POA: Diagnosis not present

## 2017-11-24 DIAGNOSIS — R062 Wheezing: Secondary | ICD-10-CM | POA: Diagnosis not present

## 2017-11-24 DIAGNOSIS — R0602 Shortness of breath: Secondary | ICD-10-CM | POA: Diagnosis not present

## 2017-12-06 ENCOUNTER — Encounter: Payer: Self-pay | Admitting: Internal Medicine

## 2017-12-06 NOTE — Telephone Encounter (Signed)
Per previous TE, pt requested Vit D injections rather than taking PO supplements.  Dr Fabian SharpPanosh did not recommend injections and advised her to take Vit D Supplement.    Nadara EatonAshtyn Sherice Ijames, CMA 5:21 PM  Note  Per Dr Fabian SharpPanosh we do not do Vit D shots but the oral medications can be adjusted depending on labs.  Pt needs to come and have her Vit D levels checked.  Pt should be taking Vit D 5000IU daily.  Vit D levels may not be the cause of the bone aches but we will look at the levels      Nadara EatonAshtyn Karlo Goeden, CMA 10:32 AM  Note  Pt aware of rec's per Dr Fabian SharpPanosh  Declined blood work at this time.  Pt states that she is not interested in taking po Vit D bc they are so potent.  Pt advised that to best treat her we need to know what her levels are -- still declined.      Pt is requesting injections again.  Pt is requesting a referral if we cannot do this for her.  (see e-mail) Please advise Dr Fabian SharpPanosh, thanks.

## 2017-12-06 NOTE — Telephone Encounter (Signed)
reviewed record    advised to get Vit d level  And IPTH  But message was that  She didn't want to check this. At the time   I suggest she get the  Vit d level  n  And intact PTH and updated  tsh free t4       And then we can refer her to  An endocrinologist. With results

## 2018-01-19 DIAGNOSIS — J45901 Unspecified asthma with (acute) exacerbation: Secondary | ICD-10-CM | POA: Diagnosis not present

## 2018-01-19 DIAGNOSIS — R635 Abnormal weight gain: Secondary | ICD-10-CM | POA: Diagnosis not present

## 2018-01-19 DIAGNOSIS — K5900 Constipation, unspecified: Secondary | ICD-10-CM | POA: Diagnosis not present

## 2018-02-14 DIAGNOSIS — J209 Acute bronchitis, unspecified: Secondary | ICD-10-CM | POA: Diagnosis not present

## 2018-02-14 DIAGNOSIS — J4540 Moderate persistent asthma, uncomplicated: Secondary | ICD-10-CM | POA: Diagnosis not present

## 2018-02-14 DIAGNOSIS — J300 Vasomotor rhinitis: Secondary | ICD-10-CM | POA: Diagnosis not present

## 2018-02-14 DIAGNOSIS — L209 Atopic dermatitis, unspecified: Secondary | ICD-10-CM | POA: Diagnosis not present

## 2018-02-19 DIAGNOSIS — R062 Wheezing: Secondary | ICD-10-CM | POA: Diagnosis not present

## 2018-02-19 DIAGNOSIS — R05 Cough: Secondary | ICD-10-CM | POA: Diagnosis not present

## 2018-02-19 DIAGNOSIS — J302 Other seasonal allergic rhinitis: Secondary | ICD-10-CM | POA: Diagnosis not present

## 2018-02-19 DIAGNOSIS — J45909 Unspecified asthma, uncomplicated: Secondary | ICD-10-CM | POA: Diagnosis not present

## 2018-02-26 DIAGNOSIS — R05 Cough: Secondary | ICD-10-CM | POA: Diagnosis not present

## 2018-02-26 DIAGNOSIS — R062 Wheezing: Secondary | ICD-10-CM | POA: Diagnosis not present

## 2018-02-26 DIAGNOSIS — J45909 Unspecified asthma, uncomplicated: Secondary | ICD-10-CM | POA: Diagnosis not present

## 2018-02-26 DIAGNOSIS — J302 Other seasonal allergic rhinitis: Secondary | ICD-10-CM | POA: Diagnosis not present

## 2018-03-20 DIAGNOSIS — K5900 Constipation, unspecified: Secondary | ICD-10-CM | POA: Diagnosis not present

## 2018-03-20 DIAGNOSIS — R635 Abnormal weight gain: Secondary | ICD-10-CM | POA: Diagnosis not present

## 2018-03-29 DIAGNOSIS — E559 Vitamin D deficiency, unspecified: Secondary | ICD-10-CM | POA: Diagnosis not present

## 2018-03-29 DIAGNOSIS — R300 Dysuria: Secondary | ICD-10-CM | POA: Diagnosis not present

## 2018-03-29 DIAGNOSIS — E7800 Pure hypercholesterolemia, unspecified: Secondary | ICD-10-CM | POA: Diagnosis not present

## 2018-03-29 DIAGNOSIS — R5383 Other fatigue: Secondary | ICD-10-CM | POA: Diagnosis not present

## 2018-03-29 DIAGNOSIS — Z79899 Other long term (current) drug therapy: Secondary | ICD-10-CM | POA: Diagnosis not present

## 2018-04-16 DIAGNOSIS — R635 Abnormal weight gain: Secondary | ICD-10-CM | POA: Diagnosis not present

## 2018-04-16 DIAGNOSIS — E559 Vitamin D deficiency, unspecified: Secondary | ICD-10-CM | POA: Diagnosis not present

## 2018-04-16 DIAGNOSIS — E7800 Pure hypercholesterolemia, unspecified: Secondary | ICD-10-CM | POA: Diagnosis not present

## 2018-04-16 DIAGNOSIS — R300 Dysuria: Secondary | ICD-10-CM | POA: Diagnosis not present

## 2018-06-22 DIAGNOSIS — Z0100 Encounter for examination of eyes and vision without abnormal findings: Secondary | ICD-10-CM | POA: Diagnosis not present

## 2018-06-22 DIAGNOSIS — H5200 Hypermetropia, unspecified eye: Secondary | ICD-10-CM | POA: Diagnosis not present

## 2018-08-03 DIAGNOSIS — J302 Other seasonal allergic rhinitis: Secondary | ICD-10-CM | POA: Diagnosis not present

## 2018-08-03 DIAGNOSIS — R062 Wheezing: Secondary | ICD-10-CM | POA: Diagnosis not present

## 2018-08-03 DIAGNOSIS — E7800 Pure hypercholesterolemia, unspecified: Secondary | ICD-10-CM | POA: Diagnosis not present

## 2018-08-03 DIAGNOSIS — J45901 Unspecified asthma with (acute) exacerbation: Secondary | ICD-10-CM | POA: Diagnosis not present

## 2018-09-11 ENCOUNTER — Ambulatory Visit: Payer: Self-pay | Admitting: *Deleted

## 2018-09-11 NOTE — Telephone Encounter (Signed)
Noted  
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
## 2018-09-12 ENCOUNTER — Ambulatory Visit (INDEPENDENT_AMBULATORY_CARE_PROVIDER_SITE_OTHER): Payer: Medicare HMO | Admitting: Family Medicine

## 2018-09-12 ENCOUNTER — Encounter: Payer: Self-pay | Admitting: Family Medicine

## 2018-09-12 VITALS — BP 102/80 | HR 68 | Temp 98.2°F | Ht 61.75 in | Wt 222.4 lb

## 2018-09-12 DIAGNOSIS — J309 Allergic rhinitis, unspecified: Secondary | ICD-10-CM | POA: Diagnosis not present

## 2018-09-12 DIAGNOSIS — M549 Dorsalgia, unspecified: Secondary | ICD-10-CM

## 2018-09-12 MED ORDER — CYCLOBENZAPRINE HCL 5 MG PO TABS: 5.0000 mg | ORAL_TABLET | Freq: Every day | ORAL | 0 refills | Status: DC

## 2018-09-12 MED ORDER — MONTELUKAST SODIUM 10 MG PO TABS: ORAL_TABLET | ORAL | 3 refills | Status: DC

## 2018-09-12 NOTE — Patient Instructions (Signed)
Continue with moist heat and muscle massage  Can take the Flexeril at night- may cause some sedation.
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
## 2018-09-19 ENCOUNTER — Encounter: Payer: Self-pay | Admitting: Internal Medicine

## 2018-09-19 ENCOUNTER — Ambulatory Visit (INDEPENDENT_AMBULATORY_CARE_PROVIDER_SITE_OTHER): Payer: Medicare HMO | Admitting: Internal Medicine

## 2018-09-19 VITALS — BP 148/78 | HR 67 | Temp 98.0°F | Ht 61.5 in | Wt 222.9 lb

## 2018-09-19 DIAGNOSIS — J45909 Unspecified asthma, uncomplicated: Secondary | ICD-10-CM | POA: Diagnosis not present

## 2018-09-19 DIAGNOSIS — Z5189 Encounter for other specified aftercare: Secondary | ICD-10-CM

## 2018-09-19 DIAGNOSIS — I10 Essential (primary) hypertension: Secondary | ICD-10-CM

## 2018-09-19 DIAGNOSIS — Z5320 Procedure and treatment not carried out because of patient's decision for unspecified reasons: Secondary | ICD-10-CM | POA: Diagnosis not present

## 2018-09-19 DIAGNOSIS — Z0000 Encounter for general adult medical examination without abnormal findings: Secondary | ICD-10-CM | POA: Diagnosis not present

## 2018-09-19 DIAGNOSIS — E559 Vitamin D deficiency, unspecified: Secondary | ICD-10-CM

## 2018-09-19 DIAGNOSIS — Z2821 Immunization not carried out because of patient refusal: Secondary | ICD-10-CM

## 2018-09-19 DIAGNOSIS — G479 Sleep disorder, unspecified: Secondary | ICD-10-CM | POA: Diagnosis not present

## 2018-09-19 DIAGNOSIS — Z79899 Other long term (current) drug therapy: Secondary | ICD-10-CM | POA: Diagnosis not present

## 2018-09-19 DIAGNOSIS — J309 Allergic rhinitis, unspecified: Secondary | ICD-10-CM

## 2018-09-19 LAB — BASIC METABOLIC PANEL
BUN: 10 mg/dL (ref 6–23)
CO2: 27 mEq/L (ref 19–32)
Calcium: 9.4 mg/dL (ref 8.4–10.5)
Chloride: 105 mEq/L (ref 96–112)
Creatinine, Ser: 0.7 mg/dL (ref 0.40–1.20)
GFR: 105.99 mL/min (ref >60.00)
Glucose, Bld: 87 mg/dL (ref 70–99)
Potassium: 4.1 mEq/L (ref 3.5–5.1)
Sodium: 140 mEq/L (ref 135–145)

## 2018-09-19 LAB — HEPATIC FUNCTION PANEL
ALT: 18 U/L (ref 0–35)
AST: 19 U/L (ref 0–37)
Albumin: 4.4 g/dL (ref 3.5–5.2)
Alkaline Phosphatase: 84 U/L (ref 39–117)
Bilirubin, Direct: 0.1 mg/dL (ref 0.0–0.3)
Total Bilirubin: 0.5 mg/dL (ref 0.2–1.2)
Total Protein: 6.6 g/dL (ref 6.0–8.3)

## 2018-09-19 LAB — TSH: TSH: 1.17 u[IU]/mL (ref 0.35–4.50)

## 2018-09-19 LAB — VITAMIN D 25 HYDROXY (VIT D DEFICIENCY, FRACTURES): VITD: 37.79 ng/mL (ref 30.00–100.00)

## 2018-09-19 LAB — LIPID PANEL
Cholesterol: 230 mg/dL — ABNORMAL HIGH (ref 0–200)
HDL: 61.4 mg/dL (ref >39.00)
LDL Cholesterol: 145 mg/dL — ABNORMAL HIGH (ref 0–99)
NonHDL: 168.48
Total CHOL/HDL Ratio: 4
Triglycerides: 116 mg/dL (ref 0.0–149.0)
VLDL: 23.2 mg/dL (ref 0.0–40.0)

## 2018-09-19 LAB — HEMOGLOBIN A1C: Hgb A1c MFr Bld: 6.1 % (ref 4.6–6.5)

## 2018-09-19 NOTE — Patient Instructions (Addendum)
I suggest taking  The  Diuretic pill every day   To help wioth blood pressure control  Goal is close to 120/80     Could take 1/2 of the HCTZ every day and do jsut as well.  For BP and swelling.   Ask dr Orvil Feil about getting  Approval  For proair vs the ventolin. Checking lab today .   take otc vit d 800 - 1000 iu per day  Unless told otherwise .  I fall ok then  cpx in a year     Preventive Care 27 Years and Older, Female Preventive care refers to lifestyle choices and visits with your health care provider that can promote health and wellness. What does preventive care include?  A yearly physical exam. This is also called an annual well check.  Dental exams once or twice a year.  Routine eye exams. Ask your health care provider how often you should have your eyes checked.  Personal lifestyle choices, including: ? Daily care of your teeth and gums. ? Regular physical activity. ? Eating a healthy diet. ? Avoiding tobacco and drug use. ? Limiting alcohol use. ? Practicing safe sex. ? Taking low-dose aspirin every day. ? Taking vitamin and mineral supplements as recommended by your health care provider. What happens during an annual well check? The services and screenings done by your health care provider during your annual well check will depend on your age, overall health, lifestyle risk factors, and family history of disease. Counseling Your health care provider may ask you questions about your:  Alcohol use.  Tobacco use.  Drug use.  Emotional well-being.  Home and relationship well-being.  Sexual activity.  Eating habits.  History of falls.  Memory and ability to understand (cognition).  Work and work Statistician.  Reproductive health.  Screening You may have the following tests or measurements:  Height, weight, and BMI.  Blood pressure.  Lipid and cholesterol levels. These may be checked every 5 years, or more frequently if you are over 31 years  old.  Skin check.  Lung cancer screening. You may have this screening every year starting at age 79 if you have a 30-pack-year history of smoking and currently smoke or have quit within the past 15 years.  Fecal occult blood test (FOBT) of the stool. You may have this test every year starting at age 68.  Flexible sigmoidoscopy or colonoscopy. You may have a sigmoidoscopy every 5 years or a colonoscopy every 10 years starting at age 64.  Hepatitis C blood test.  Hepatitis B blood test.  Sexually transmitted disease (STD) testing.  Diabetes screening. This is done by checking your blood sugar (glucose) after you have not eaten for a while (fasting). You may have this done every 1-3 years.  Bone density scan. This is done to screen for osteoporosis. You may have this done starting at age 34.  Mammogram. This may be done every 1-2 years. Talk to your health care provider about how often you should have regular mammograms.  Talk with your health care provider about your test results, treatment options, and if necessary, the need for more tests. Vaccines Your health care provider may recommend certain vaccines, such as:  Influenza vaccine. This is recommended every year.  Tetanus, diphtheria, and acellular pertussis (Tdap, Td) vaccine. You may need a Td booster every 10 years.  Varicella vaccine. You may need this if you have not been vaccinated.  Zoster vaccine. You may need this after age 51.  Measles, mumps, and rubella (MMR) vaccine. You may need at least one dose of MMR if you were born in 1957 or later. You may also need a second dose.  Pneumococcal 13-valent conjugate (PCV13) vaccine. One dose is recommended after age 73.  Pneumococcal polysaccharide (PPSV23) vaccine. One dose is recommended after age 45.  Meningococcal vaccine. You may need this if you have certain conditions.  Hepatitis A vaccine. You may need this if you have certain conditions or if you travel or work  in places where you may be exposed to hepatitis A.  Hepatitis B vaccine. You may need this if you have certain conditions or if you travel or work in places where you may be exposed to hepatitis B.  Haemophilus influenzae type b (Hib) vaccine. You may need this if you have certain conditions.  Talk to your health care provider about which screenings and vaccines you need and how often you need them. This information is not intended to replace advice given to you by your health care provider. Make sure you discuss any questions you have with your health care provider. Document Released: 10/30/2015 Document Revised: 06/22/2016 Document Reviewed: 08/04/2015 Elsevier Interactive Patient Education  [employer].

## 2018-09-27 ENCOUNTER — Telehealth: Payer: Self-pay

## 2018-09-27 NOTE — Telephone Encounter (Signed)
[facility]. Rx is not on patients med list.  Left message for patient to call back so that we may verify whether or not the patient is still taking this medication

## 2018-10-02 NOTE — Telephone Encounter (Signed)
Patient states that she is not on flexeril anymore.  She stated that she would like a refill on her Proair, that ventolin inhaler does not work for her.  Patient was also given lab results from 12/4/9. Expressed understanding. Nothing further needed

## 2018-10-02 NOTE — Telephone Encounter (Signed)
Proventil Inhaler has not been filled since 2016 Proventil last filled 01/2017 Will get Dr Rosezella FloridaPanosh's okay to refill since not filled in over 1 year and change back to [employer]air.

## 2018-10-04 ENCOUNTER — Encounter: Payer: Self-pay | Admitting: Family Medicine

## 2018-10-04 ENCOUNTER — Ambulatory Visit (INDEPENDENT_AMBULATORY_CARE_PROVIDER_SITE_OTHER): Payer: Medicare HMO | Admitting: Family Medicine

## 2018-10-04 VITALS — BP 150/70 | HR 68 | Temp 98.1°F | Wt 222.1 lb

## 2018-10-04 DIAGNOSIS — L603 Nail dystrophy: Secondary | ICD-10-CM | POA: Diagnosis not present

## 2018-10-04 DIAGNOSIS — L659 Nonscarring hair loss, unspecified: Secondary | ICD-10-CM | POA: Diagnosis not present

## 2018-10-04 NOTE — Progress Notes (Signed)
Subjective:    Patient ID: Jill Burton, female    DOB: 1946/05/25, 72 y.o.   MRN: [id number]  Chief Complaint  Patient presents with  . Alopecia    HPI Patient was seen today for acute visit.  Patient is typically seen by Dr. Fabian SharpPanosh.  She was just seen on 12/4 for preventative health visit.    Thinning hair  -x 2 months  -taking a Women's MVI daily.  -tried OTC hair loss products for short periods of time.   -not using harsh chemicals such as perms or relaxers.   -area does not itch, denies rash -does not want to use Rogaine. -started taking HCTZ daily after last OFV.  Brittle nails: -was getting acrylic nails/polish x yrs.  -left her nails natural x maybe 6 months -recently switched to SNS powder nail color. -endorses getting her hands "wet a lot" -taking MVI  Past Medical History:  Diagnosis Date  . Allergic rhinitis    allergy testing neg in 2011  . Anemia   . Asthma   . Hyperlipidemia    no meds  . Hypertension    does not take bp meds on a regular basis  . SVD (spontaneous vaginal delivery)    x 1    Allergies  Allergen Reactions  . Penicillins   . Sulfonamide Derivatives     ROS General: Denies fever, chills, night sweats, changes in weight, changes in appetite HEENT: Denies headaches, ear pain, changes in vision, rhinorrhea, sore throat   +hair loss CV: Denies CP, palpitations, SOB, orthopnea Pulm: Denies SOB, cough, wheezing GI: Denies abdominal pain, nausea, vomiting, diarrhea, constipation GU: Denies dysuria, hematuria, frequency, vaginal discharge Msk: Denies muscle cramps, joint pains Neuro: Denies weakness, numbness, tingling Skin: Denies rashes, bruising  +brittle nails Psych: Denies depression, anxiety, hallucinations    Objective:    Blood pressure (!) 150/70, pulse 68, temperature 98.1 F (36.7 C), temperature source Oral, weight 222 lb 1.6 oz (100.7 kg), SpO2 98 %.   Gen. Pleasant, well-nourished, in no distress, normal affect   HEENT: [place]/AT, face symmetric, no scleral icterus, PERRLA, nares patent without drainage Lungs: no accessory muscle use Cardiovascular: RRR, no peripheral edema Neuro:  A&Ox3, CN II-XII intact, normal gait Skin:  Warm, no lesions/ rash.  Roots of hair gray/silver in color with black ends.  A 1.5-2 cm area of thinning hair in L scalp near temporal area.  Normal appearing hair follicles surrounding the area.  No broken off hair follicles present in the area.  Scalp is smooth and shiny, no erythema, scaling, bumps, or ring lesions noted.   Wt Readings from Last 3 Encounters:  10/04/18 222 lb 1.6 oz (100.7 kg)  09/19/18 222 lb 14.4 oz (101.1 kg)  09/12/18 222 lb 6.4 oz (100.9 kg)    Lab Results  Component Value Date   WBC 7.0 12/13/2016   HGB 14.6 12/13/2016   HCT 44.5 12/13/2016   PLT 212.0 12/13/2016   GLUCOSE 87 09/19/2018   CHOL 230 (H) 09/19/2018   TRIG 116.0 09/19/2018   HDL 61.40 09/19/2018   LDLDIRECT 157.4 09/23/2013   LDLCALC 145 (H) 09/19/2018   ALT 18 09/19/2018   AST 19 09/19/2018   NA 140 09/19/2018   K 4.1 09/19/2018   CL 105 09/19/2018   CREATININE 0.70 09/19/2018   BUN 10 09/19/2018   CO2 27 09/19/2018   TSH 1.17 09/19/2018   HGBA1C 6.1 09/19/2018    Assessment/Plan:  Hair loss  -Discussed OTC hair loss  products that may be beneficial for patient. -Patient does not wish to try Rogaine after hearing it could increase facial hair. -Discussed female pattern balding -Patient given handout -Consider referral to dermatology for further intervention.  Brittle nails -Discussed taking a break from gentle, acrylic, S&S nail polish use. -Okay to continue daily MVI  Follow-up PRN with PCP  Abbe AmsterdamShannon Kaliya Shreiner, MD

## 2018-10-05 NOTE — Telephone Encounter (Signed)
She sees an allergist I think it is  Dr Barnetta ChapelWhelan   Please have patient request   From her allergist who is managing her  Asthma

## 2018-10-22 NOTE — Telephone Encounter (Signed)
Pt aware. Nothing further needed 

## 2018-11-01 ENCOUNTER — Telehealth: Payer: Self-pay | Admitting: Internal Medicine

## 2018-11-01 NOTE — Telephone Encounter (Signed)
Patient states she is having a harder time sleeping at night.  She has tried homeopathic methods such as Melatonin and it isn't helping either.  She is requesting a prescription for something to help her sleep.  Patient needs a refill on HCTZ.  Pharmacy: [facility] on [address] [place]

## 2018-11-01 NOTE — Telephone Encounter (Signed)
Sending to Dr.Panosh to advise

## 2018-11-06 NOTE — Telephone Encounter (Signed)
Ok to refill hctz for 1 year. Do not have   Safe  Medicine  For sleep .  On a regular basis .  Would need more information or ov to discuss options   For insomnia

## 2018-11-09 MED ORDER — HYDROCHLOROTHIAZIDE 25 MG PO TABS: 25.0000 mg | ORAL_TABLET | ORAL | 3 refills | Status: DC | PRN

## 2018-11-09 NOTE — Telephone Encounter (Signed)
Pt aware that we are refilling her HCTZ x 1 yr.  Pt did not want to scheduled an appt at this time.   Nothing further needed.

## 2018-12-10 ENCOUNTER — Ambulatory Visit: Payer: Medicare HMO | Admitting: Internal Medicine

## 2018-12-10 DIAGNOSIS — L209 Atopic dermatitis, unspecified: Secondary | ICD-10-CM | POA: Diagnosis not present

## 2018-12-10 DIAGNOSIS — J209 Acute bronchitis, unspecified: Secondary | ICD-10-CM | POA: Diagnosis not present

## 2018-12-10 DIAGNOSIS — J300 Vasomotor rhinitis: Secondary | ICD-10-CM | POA: Diagnosis not present

## 2018-12-10 DIAGNOSIS — J4540 Moderate persistent asthma, uncomplicated: Secondary | ICD-10-CM | POA: Diagnosis not present

## 2019-02-11 ENCOUNTER — Other Ambulatory Visit: Payer: Self-pay

## 2019-02-11 ENCOUNTER — Encounter: Payer: Self-pay | Admitting: Internal Medicine

## 2019-02-11 ENCOUNTER — Ambulatory Visit (INDEPENDENT_AMBULATORY_CARE_PROVIDER_SITE_OTHER): Payer: Medicare Other | Admitting: Internal Medicine

## 2019-02-11 DIAGNOSIS — M25551 Pain in right hip: Secondary | ICD-10-CM | POA: Diagnosis not present

## 2019-02-11 DIAGNOSIS — M25511 Pain in right shoulder: Secondary | ICD-10-CM | POA: Diagnosis not present

## 2019-02-11 MED ORDER — METHOCARBAMOL 500 MG PO TABS: 500.0000 mg | ORAL_TABLET | Freq: Four times a day (QID) | ORAL | 0 refills | Status: DC | PRN

## 2019-02-11 NOTE — Progress Notes (Signed)
Virtual Visit via Video Note  I connected with@ on 02/11/19 at  2:00 PM EDT by a video enabled telemedicine application and verified that I am speaking with the correct person using two identifiers. Location patient: home Location provider:work office Persons participating in the virtual visit: patient, provider  W[place]th national recommendations  regarding COVID 19 pandemic   video visit is advised over in office visit for this patient.  Discussed the limitations of evaluation and management by telemedicine and  availability of in person appointments. The patient expressed understanding and agreed to proceed.  HPI: Jill Burton  Presents for video  Visit  For   right hip pain  Ongoing for a while  May have flared up  After slipping out of chair  weeks ago .  Able to walk     No limp  Keeps here aware  Laying on side.   Can go up steps  But some discomfort  right shoulder  Pain when over using and pulling  Down motions   Trying ibuprofen  And  Topical some help.    See 2014 visit about  Hip pain  Right  consdier  Ortho  Advised   Asks about trying a muscle relaxant   ROS: See pertinent positives and negatives per HPI.  Past Medical History:  Diagnosis Date  . Allergic rhinitis    allergy testing neg in 2011  . Anemia   . Asthma   . Hyperlipidemia    no meds  . Hypertension    does not take bp meds on a regular basis  . SVD (spontaneous vaginal delivery)    x 1    Past Surgical History:  Procedure Laterality Date  . CESAREAN SECTION     x3  . DILITATION & CURRETTAGE/HYSTROSCOPY WITH VERSAPOINT RESECTION N/A 11/21/2013   Procedure: DILATATION & CURETTAGE/HYSTEROSCOPY WITH VERSAPOINT RESECTION;  Surgeon: Serita KyleSheronette A Cousins, MD;  Location: [facility];  Service: Gynecology;  Laterality: N/A;  . HERNIA REPAIR     as child  . TUBAL LIGATION      Family History  Problem Relation Age of Onset  . Asthma Unknown     Social History   Tobacco Use  . Smoking status: Never  Smoker  . Smokeless tobacco: Never Used  Substance Use Topics  . Alcohol use: No  . Drug use: No      Current Outpatient Medications:  .  albuterol (PROAIR HFA) 108 (90 Base) MCG/ACT inhaler, ProAir HFA 90 mcg/actuation aerosol inhaler  INHALE 2 PUFFS EVERY 4 HOURS AS NEEDED, Disp: , Rfl:  .  albuterol (PROVENTIL) (2.5 MG/3ML) 0.083% nebulizer solution, INHALE CONTENTS OF 1 VIAL VIA NEBULIZER EVERY 6 HOURS AS NEEDED FOR WHEEZING OR SHORTNESS OF BREATH, Disp: 75 mL, Rfl: 0 .  Cyanocobalamin (VITAMIN B 12 PO), Take by mouth., Disp: , Rfl:  .  Fluticasone-Salmeterol (ADVAIR DISKUS) 250-50 MCG/DOSE AEPB, USE 1 PUFF AS NEEDED, Disp: 60 each, Rfl: 0 .  hydrochlorothiazide (HYDRODIURIL) 25 MG tablet, Take 1 tablet (25 mg total) by mouth as needed., Disp: 90 tablet, Rfl: 3 .  hydrocortisone 2.5 % cream, Apply topically 2 (two) times daily. If needed for dermatitis limit use, Disp: 20 g, Rfl: 0 .  methocarbamol (ROBAXIN) 500 MG tablet, Take 1-2 tablets (500-1,000 mg total) by mouth every 6 (six) hours as needed for muscle spasms., Disp: 40 tablet, Rfl: 0 .  montelukast (SINGULAIR) 10 MG tablet, TAKE 1 TABLET (10 MG TOTAL) BY MOUTH AT BEDTIME., Disp: 90 tablet,  Rfl: 3 .  PROAIR HFA 108 (90 BASE) MCG/ACT inhaler, INHALE 2 PUFFS EVERY 4 HOURS AS NEEDED, Disp: 8.5 Inhaler, Rfl: 0  EXAM: BP Readings from Last 3 Encounters:  10/04/18 (!) 150/70  09/19/18 (!) 148/78  09/12/18 102/80    VITALS per patient if applicable: video visit part was   lmimted cause of camera technique    GENERAL: alert, oriented, appears well and in no acute distress  HEENT: atraumatic, conjunttiva clear, no obvious abnormalities on inspection of external nose and ears  NECK: normal movements of the head and neck  LUNGS: on inspection no signs of respiratory distress, breathing rate appears normal, no obvious gross SOB, gasping or wheezing  CV: no obvious cyanosis  MS: moves all visible extremities without noticeable  abnormality  PSYCH/NEURO: pleasant and cooperative, no obvious depression or anxiety, speech and thought processing grossly intact   ASSESSMENT AND PLAN:  Discussed the following assessment and plan:  Right hip pain - Plan: Ambulatory referral to Orthopedic Surgery  Right shoulder pain, unspecified chronicity - Plan: Ambulatory referral to Orthopedic Surgery Right hip pain    Right shoulder pain  Counseled.   Advise orthopedics   Evaluation  As ongoing  ? If traumatic bursitis and  or other  Dennie Bible not as concerned about her  Shoulder  Feels is overuse  And can use otc topical  And muscl relaxant with caution   And warnings  Has already used nsiad and tylenol.   Expectant management and discussion of plan and treatment with patient with opportunity to ask questions and all were answered. The patient agreed with the plan and demonstrated an understanding of the instructions.  The patient was advised to call back or seek an in-person evaluation if worsening  or having concerns . In interim  Advise  Have her allergist  Refill her inhalers.   Total visit > 50% spent counseling and coordinating care as indicated in above note and in instructions to patient .     Berniece Andreas, MD

## 2019-02-14 DIAGNOSIS — M7061 Trochanteric bursitis, right hip: Secondary | ICD-10-CM | POA: Diagnosis not present

## 2019-02-14 DIAGNOSIS — M25551 Pain in right hip: Secondary | ICD-10-CM | POA: Diagnosis not present

## 2019-02-25 DIAGNOSIS — E039 Hypothyroidism, unspecified: Secondary | ICD-10-CM | POA: Diagnosis not present

## 2019-02-25 DIAGNOSIS — N951 Menopausal and female climacteric states: Secondary | ICD-10-CM | POA: Diagnosis not present

## 2019-02-25 DIAGNOSIS — D649 Anemia, unspecified: Secondary | ICD-10-CM | POA: Diagnosis not present

## 2019-02-25 DIAGNOSIS — E279 Disorder of adrenal gland, unspecified: Secondary | ICD-10-CM | POA: Diagnosis not present

## 2019-02-25 DIAGNOSIS — E782 Mixed hyperlipidemia: Secondary | ICD-10-CM | POA: Diagnosis not present

## 2019-02-25 DIAGNOSIS — R5383 Other fatigue: Secondary | ICD-10-CM | POA: Diagnosis not present

## 2019-02-25 DIAGNOSIS — E559 Vitamin D deficiency, unspecified: Secondary | ICD-10-CM | POA: Diagnosis not present

## 2019-02-25 DIAGNOSIS — E7889 Other lipoprotein metabolism disorders: Secondary | ICD-10-CM | POA: Diagnosis not present

## 2019-03-18 ENCOUNTER — Telehealth: Payer: Self-pay | Admitting: *Deleted

## 2019-03-18 DIAGNOSIS — G479 Sleep disorder, unspecified: Secondary | ICD-10-CM

## 2019-03-18 NOTE — Telephone Encounter (Signed)
Copied from CRM [id number]. Topic: Referral - Question >> Mar 18, 2019  3:05 PM Jolayne Haines L wrote: Reason for CRM: patient would like to be set up for a sleep study as she is still having trouble sleeping. Please advise.

## 2019-03-25 NOTE — Telephone Encounter (Signed)
Please refer to  Dr dohmier   Or sleep specialist at our pulmonary  [facility]  To evaluate  Sleep problem

## 2019-03-27 ENCOUNTER — Other Ambulatory Visit: Payer: Self-pay | Admitting: Internal Medicine

## 2019-03-27 DIAGNOSIS — G479 Sleep disorder, unspecified: Secondary | ICD-10-CM

## 2019-03-27 NOTE — Addendum Note (Signed)
Addended by: Modena Morrow R on: 03/27/2019 08:11 AM   Modules accepted: Orders

## 2019-03-27 NOTE — Telephone Encounter (Signed)
Referral has been placed. 

## 2019-03-27 NOTE — Progress Notes (Signed)
Ref

## 2019-04-23 ENCOUNTER — Telehealth: Payer: Self-pay | Admitting: Neurology

## 2019-04-23 ENCOUNTER — Institutional Professional Consult (permissible substitution): Payer: Commercial Managed Care - HMO | Admitting: Neurology

## 2019-04-23 ENCOUNTER — Encounter: Payer: Self-pay | Admitting: Neurology

## 2019-04-23 NOTE — Telephone Encounter (Signed)
Patient was no show to sleep consult apt today

## 2019-06-03 ENCOUNTER — Ambulatory Visit (INDEPENDENT_AMBULATORY_CARE_PROVIDER_SITE_OTHER): Payer: Medicare Other | Admitting: Family Medicine

## 2019-06-03 ENCOUNTER — Encounter: Payer: Self-pay | Admitting: Family Medicine

## 2019-06-03 DIAGNOSIS — G8929 Other chronic pain: Secondary | ICD-10-CM | POA: Diagnosis not present

## 2019-06-03 DIAGNOSIS — M25511 Pain in right shoulder: Secondary | ICD-10-CM

## 2019-06-03 MED ORDER — TIZANIDINE HCL 2 MG PO TABS: 2.0000 mg | ORAL_TABLET | Freq: Every evening | ORAL | 1 refills | Status: DC | PRN

## 2019-06-03 MED ORDER — NABUMETONE 750 MG PO TABS: 750.0000 mg | ORAL_TABLET | Freq: Two times a day (BID) | ORAL | 6 refills | Status: DC | PRN

## 2019-06-03 NOTE — Progress Notes (Signed)
Office Visit Note   Patient: Jill HazelJuanita W Burton           Date of Birth: 1946/02/28           MRN: [id number] Visit Date: 06/03/2019 Requested by: Madelin HeadingsPanosh, Wanda K, MD [address] [place],  [place] [id number] PCP: Madelin HeadingsPanosh, Wanda K, MD  Subjective: Chief Complaint  Patient presents with  . Right Shoulder - Pain    Pain in shoulder 4-5 days after lifting up/opening a window in her kitchen 6 months ago.    HPI: She is here with right shoulder pain.  Symptoms started about 6 months ago, she recalls trying to open up a window.  She did not have pain immediately, but a few days later her shoulder started to hurt.  She has had this problem in the past.  She thought it would get better with time but it has not.  It wakes her up when she rolls over on her side.  She tried medication for right hip pain a few months ago but it did not really make much difference for her shoulder.  She is right-hand dominant.  Pain seems to be on top and on the lateral aspect.              ROS: No fevers or chills.  All other systems were reviewed and are negative.  Objective: Vital Signs: There were no vitals taken for this visit.  Physical Exam:  General:  Alert and oriented, in no acute distress. Pulm:  Breathing unlabored. Psy:  Normal mood, congruent affect. Skin: No rash on her skin. Right shoulder: Full active range of motion compared to left, no adhesive capsulitis.  Slightly tender near the [facility] joint, negative AC crossover test.  Also slightly tender in the posterior subacromial space.  Isometric rotator cuff strength is 5/5 throughout.  Speeds test is negative.  Passive impingement causes a little bit of pain with abduction and internal rotation.  Imaging: None today.  Assessment & Plan: 1.  Chronic right shoulder pain, probably due to impingement but cannot rule out [facility] arthropathy. -Discussed options with her, she wants to try a different anti-inflammatory and home exercises.  If symptoms  persist we will order x-rays and possibly consider either physical therapy or an injection.  She will follow-up as needed.     Procedures: No procedures performed  No notes on file     PMFS History: Patient Active Problem List   Diagnosis Date Noted  . Sleep difficulties 03/21/2014  . Musculoskeletal pain 03/21/2014  . Hip pain 09/23/2013  . Weight gain 09/23/2013  . Essential hypertension 04/16/2013  . Alternative medicine  hx of homeopathy meds  04/12/2012  . Obesity 04/12/2012  . Fatigue 04/17/2011  . THYROID STIMULATING HORMONE, ABNORMAL 08/25/2008  . HYPERGLYCEMIA 08/25/2008  . INSOMNIA-SLEEP DISORDER-UNSPEC 08/21/2007  . Allergic rhinitis 08/21/2007  . ASTHMA 08/21/2007   Past Medical History:  Diagnosis Date  . Allergic rhinitis    allergy testing neg in 2011  . Anemia   . Asthma   . Hyperlipidemia    no meds  . Hypertension    does not take bp meds on a regular basis  . SVD (spontaneous vaginal delivery)    x 1    Family History  Problem Relation Age of Onset  . Asthma Unknown     Past Surgical History:  Procedure Laterality Date  . CESAREAN SECTION     x3  . DILITATION & CURRETTAGE/HYSTROSCOPY WITH VERSAPOINT  RESECTION N/A 11/21/2013   Procedure: DILATATION & CURETTAGE/HYSTEROSCOPY WITH VERSAPOINT RESECTION;  Surgeon: Marvene Staff, MD;  Location: [facility];  Service: Gynecology;  Laterality: N/A;  . HERNIA REPAIR     as child  . TUBAL LIGATION     Social History   Occupational History  . Not on file  Tobacco Use  . Smoking status: Never Smoker  . Smokeless tobacco: Never Used  Substance and Sexual Activity  . Alcohol use: No  . Drug use: No  . Sexual activity: Yes    Birth control/protection: Surgical

## 2019-07-10 ENCOUNTER — Telehealth: Payer: Self-pay

## 2019-07-10 DIAGNOSIS — E785 Hyperlipidemia, unspecified: Secondary | ICD-10-CM

## 2019-07-10 DIAGNOSIS — E611 Iron deficiency: Secondary | ICD-10-CM

## 2019-07-10 DIAGNOSIS — J45909 Unspecified asthma, uncomplicated: Secondary | ICD-10-CM

## 2019-07-10 DIAGNOSIS — Z79899 Other long term (current) drug therapy: Secondary | ICD-10-CM

## 2019-07-10 DIAGNOSIS — E559 Vitamin D deficiency, unspecified: Secondary | ICD-10-CM

## 2019-07-10 DIAGNOSIS — J309 Allergic rhinitis, unspecified: Secondary | ICD-10-CM

## 2019-07-10 DIAGNOSIS — I10 Essential (primary) hypertension: Secondary | ICD-10-CM

## 2019-07-10 NOTE — Telephone Encounter (Signed)
Copied from [place] [id number]. Topic: Appointment Scheduling - Scheduling Inquiry for Clinic >> Jul 10, 2019  2:19 PM Rainey Pines A wrote: Patient would like a callback from nurse in regards to an order being placed for some blood work for patient      Pt would like her vitamin D levels, iron levels,tsh, and all yearly labs put in so she get drawn before her appt on October 9th please advise

## 2019-07-10 NOTE — Addendum Note (Signed)
Addended byShanon Ace K on: 07/10/2019 03:08 PM   Modules accepted: Orders

## 2019-07-10 NOTE — Telephone Encounter (Signed)
Pt has been scheduled lab appt  

## 2019-07-10 NOTE — Telephone Encounter (Signed)
Orders have been placed she can get  Lab appt before her  Yearly visit

## 2019-07-15 ENCOUNTER — Ambulatory Visit: Payer: Medicare Other | Admitting: Family Medicine

## 2019-07-22 ENCOUNTER — Other Ambulatory Visit: Payer: Medicare Other

## 2019-07-22 DIAGNOSIS — N951 Menopausal and female climacteric states: Secondary | ICD-10-CM | POA: Diagnosis not present

## 2019-07-22 DIAGNOSIS — R7309 Other abnormal glucose: Secondary | ICD-10-CM | POA: Diagnosis not present

## 2019-07-22 DIAGNOSIS — E661 Drug-induced obesity: Secondary | ICD-10-CM | POA: Diagnosis not present

## 2019-07-22 DIAGNOSIS — E7210 Disorders of sulfur-bearing amino-acid metabolism, unspecified: Secondary | ICD-10-CM | POA: Diagnosis not present

## 2019-07-22 DIAGNOSIS — E559 Vitamin D deficiency, unspecified: Secondary | ICD-10-CM | POA: Diagnosis not present

## 2019-07-22 DIAGNOSIS — E669 Obesity, unspecified: Secondary | ICD-10-CM | POA: Diagnosis not present

## 2019-07-22 DIAGNOSIS — E279 Disorder of adrenal gland, unspecified: Secondary | ICD-10-CM | POA: Diagnosis not present

## 2019-07-22 DIAGNOSIS — E039 Hypothyroidism, unspecified: Secondary | ICD-10-CM | POA: Diagnosis not present

## 2019-07-22 DIAGNOSIS — E612 Magnesium deficiency: Secondary | ICD-10-CM | POA: Diagnosis not present

## 2019-07-22 DIAGNOSIS — E782 Mixed hyperlipidemia: Secondary | ICD-10-CM | POA: Diagnosis not present

## 2019-07-22 LAB — HEMOGLOBIN A1C: Hemoglobin A1C: 6.1

## 2019-07-22 LAB — IRON,TIBC AND FERRITIN PANEL
%SAT: 42
Ferritin: 120
Iron: 130
TIBC: 307
UIBC: 177

## 2019-07-22 LAB — VITAMIN B12: Vitamin B-12: 890

## 2019-07-22 LAB — HEPATIC FUNCTION PANEL
ALT: 18 (ref 7–35)
AST: 21 (ref 13–35)
Alkaline Phosphatase: 110 (ref 25–125)
Bilirubin, Total: 0.5

## 2019-07-22 LAB — COMPREHENSIVE METABOLIC PANEL
Albumin: 4.5 (ref 3.5–5.0)
Calcium: 9.5 (ref 8.7–10.7)
Globulin: 2.1

## 2019-07-22 LAB — BASIC METABOLIC PANEL
BUN: 13 (ref 4–21)
CO2: 25 — AB (ref 13–22)
Chloride: 103 (ref 99–108)
Creatinine: 0.9 (ref 0.5–1.1)
Glucose: 88
Potassium: 4.6 (ref 3.4–5.3)
Sodium: 141 (ref 137–147)

## 2019-07-22 LAB — TSH: TSH: 1.28 (ref 0.41–5.90)

## 2019-07-22 LAB — VITAMIN D 25 HYDROXY (VIT D DEFICIENCY, FRACTURES): Vit D, 25-Hydroxy: 37.9

## 2019-07-22 LAB — LIPID PANEL
Cholesterol: 276 — AB (ref 0–200)
HDL: 74 — AB (ref 35–70)
LDL Cholesterol: 177
Triglycerides: 140 (ref 40–160)

## 2019-07-26 ENCOUNTER — Ambulatory Visit: Payer: Medicare Other | Admitting: Internal Medicine

## 2019-08-19 ENCOUNTER — Other Ambulatory Visit: Payer: Self-pay | Admitting: Family Medicine

## 2019-08-19 ENCOUNTER — Other Ambulatory Visit: Payer: Self-pay | Admitting: Physician Assistant

## 2019-08-19 ENCOUNTER — Ambulatory Visit
Admission: RE | Admit: 2019-08-19 | Discharge: 2019-08-19 | Disposition: A | Discharge status: Home / Self Care | Payer: Medicare Other | Source: Ambulatory Visit | Attending: Family Medicine | Admitting: Family Medicine

## 2019-08-19 DIAGNOSIS — M25611 Stiffness of right shoulder, not elsewhere classified: Secondary | ICD-10-CM

## 2019-08-19 DIAGNOSIS — M25511 Pain in right shoulder: Secondary | ICD-10-CM | POA: Diagnosis not present

## 2019-08-26 ENCOUNTER — Encounter: Payer: Self-pay | Admitting: Internal Medicine

## 2019-08-29 NOTE — Progress Notes (Signed)
Chief Complaint  Patient presents with  . Follow-up    HPI: Jill Burton 73 y.o. come in for Chronic disease management   Problem with sleep taking melatonin lef shoulder still problematic and is having her homeopathic clinician help out getting some better.  Using topical Voltaren and lidocaine asked about branded medicine versus generic.  Her weight is picked up since Covid she had gotten down at least to 223 in the past her daughter disabled died on Mother's Day age 72 but she has been getting through that okay.   Asthma stable  Taking med s declines vaccinations as in past still wants to decline the flu vaccine nothing in her body "".  She brings a copy of blood work done in October from her homeopathic physician which includes full chemistries lipids thyroid including hormonal levels of various sorts including cortisol.  Her hemoglobin A1c was 6.1 total cholesterol 276 and an LDL of 177.  Triglycerides 140.  Her potassium 4.6 creatinine 0.86.  Ask about getting a bone density no history of fracture.   She states her blood pressures been good below 140/90 she is taking the HCTZ mostly for swelling about 4 times a week and not really for her blood pressure because she states it is okay.  Sleep melatonin and some magnesium   ROS: See pertinent positives and negatives per HPI.  Past Medical History:  Diagnosis Date  . Allergic rhinitis    allergy testing neg in 2011  . Anemia   . Asthma   . Hyperlipidemia    no meds  . Hypertension    does not take bp meds on a regular basis  . SVD (spontaneous vaginal delivery)    x 1    Family History  Problem Relation Age of Onset  . Asthma Unknown     Social History   Socioeconomic History  . Marital status: Married    Spouse name: Not on file  . Number of children: Not on file  . Years of education: Not on file  . Highest education level: Not on file  Occupational History  . Not on file  Social Needs  .  Financial resource strain: Not on file  . Food insecurity    Worry: Not on file    Inability: Not on file  . Transportation needs    Medical: Not on file    Non-medical: Not on file  Tobacco Use  . Smoking status: Never Smoker  . Smokeless tobacco: Never Used  Substance and Sexual Activity  . Alcohol use: No  . Drug use: No  . Sexual activity: Yes    Birth control/protection: Surgical  Lifestyle  . Physical activity    Days per week: Not on file    Minutes per session: Not on file  . Stress: Not on file  Relationships  . Social Musicianconnections    Talks on phone: Not on file    Gets together: Not on file    Attends religious service: Not on file    Active member of club or organization: Not on file    Attends meetings of clubs or organizations: Not on file    Relationship status: Not on file  Other Topics Concern  . 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      hh of 2     Outpatient Medications  Prior to Visit  Medication Sig Dispense Refill  . albuterol (PROAIR HFA) 108 (90 Base) MCG/ACT inhaler ProAir HFA 90 mcg/actuation aerosol inhaler  INHALE 2 PUFFS EVERY 4 HOURS AS NEEDED    . albuterol (PROVENTIL) (2.5 MG/3ML) 0.083% nebulizer solution INHALE CONTENTS OF 1 VIAL VIA NEBULIZER EVERY 6 HOURS AS NEEDED FOR WHEEZING OR SHORTNESS OF BREATH 75 mL 0  . Ascorbic Acid (VITAMIN C) 1000 MG tablet Take 1,000 mg by mouth daily.    . Cyanocobalamin (VITAMIN B 12 PO) Take by mouth.    . Fluticasone-Salmeterol (ADVAIR DISKUS) 250-50 MCG/DOSE AEPB USE 1 PUFF AS NEEDED 60 each 0  . hydrochlorothiazide (HYDRODIURIL) 25 MG tablet Take 1 tablet (25 mg total) by mouth as needed. 90 tablet 3  . hydrocortisone 2.5 % cream Apply topically 2 (two) times daily. If needed for dermatitis limit use 20 g 0  . montelukast (SINGULAIR) 10 MG tablet TAKE 1 TABLET (10 MG TOTAL) BY MOUTH AT BEDTIME. 90 tablet 3  .  nabumetone (RELAFEN) 750 MG tablet Take 1 tablet (750 mg total) by mouth 2 (two) times daily as needed. 60 tablet 6  . PROAIR HFA 108 (90 BASE) MCG/ACT inhaler INHALE 2 PUFFS EVERY 4 HOURS AS NEEDED 8.5 Inhaler 0  . Probiotic Product (RESTORA) CAPS Take 1 capsule by mouth daily.    . methocarbamol (ROBAXIN) 500 MG tablet Take 1-2 tablets (500-1,000 mg total) by mouth every 6 (six) hours as needed for muscle spasms. (Patient not taking: Reported on 06/03/2019) 40 tablet 0  . tiZANidine (ZANAFLEX) 2 MG tablet Take 1-2 tablets (2-4 mg total) by mouth at bedtime as needed for muscle spasms. (Patient not taking: Reported on 08/30/2019) 60 tablet 1   No facility-administered medications prior to visit.      EXAM:  BP 132/68 (BP Location: Right Arm, Patient Position: Sitting, Cuff Size: Normal)   Pulse 85   Temp 97.6 F (36.4 C) (Temporal)   Wt 233 lb 12.8 oz (106.1 kg)   SpO2 97%   BMI 43.46 kg/m   Body mass index is 43.46 kg/m.  GENERAL: vitals reviewed and listed above, alert, oriented, appears well hydrated and in no acute distress HEENT: atraumatic, conjunctiva  clear, no obvious abnormalities on inspection of external nose and ears OP : Masked today NECK: no obvious masses on inspection palpation  LUNGS: clear to auscultation bilaterally, there is a rare wheezes, rales or rhonchi, good air movement CV: HRRR, no clubbing cyanosis or  peripheral edema nl cap refill  MS: moves all extremities without noticeable focal  Abnormality Abdomen soft without organomegaly guarding or rebound. PSYCH: pleasant and cooperative, no obvious depression or anxiety Lab Results  Component Value Date   WBC 7.0 12/13/2016   HGB 14.6 12/13/2016   HCT 44.5 12/13/2016   PLT 212.0 12/13/2016   GLUCOSE 87 09/19/2018   CHOL 230 (H) 09/19/2018   TRIG 116.0 09/19/2018   HDL 61.40 09/19/2018   LDLDIRECT 157.4 09/23/2013   LDLCALC 145 (H) 09/19/2018   ALT 18 09/19/2018   AST 19 09/19/2018   NA 140  09/19/2018   K 4.1 09/19/2018   CL 105 09/19/2018   CREATININE 0.70 09/19/2018   BUN 10 09/19/2018   CO2 27 09/19/2018   TSH 1.17 09/19/2018   HGBA1C 6.1 09/19/2018   BP Readings from Last 3 Encounters:  08/30/19 132/68  10/04/18 (!) 150/70  09/19/18 (!) 148/78   Wt Readings from Last 3 Encounters:  08/30/19 233 lb 12.8 oz (106.1  kg)  10/04/18 222 lb 1.6 oz (100.7 kg)  09/19/18 222 lb 14.4 oz (101.1 kg)   Lab reviewed  To be abstracted and scanned  ASSESSMENT AND PLAN:  Discussed the following assessment and plan:  Essential hypertension - encouragd to take med daily  as tolerated ( leg cramps)( taking 3-4 days a week for swelling  at this time)  Medication management  Hyperlipidemia, unspecified hyperlipidemia type  Sleep difficulties  Estrogen deficiency - Plan: DG Bone Density  Influenza vaccination declined by patient  Alternative medicine  hx of homeopathy meds   Asthma, chronic, unspecified asthma severity, uncomplicated Reviewed the labs given her lipids are deteriorated from last time with her LDL of 170 discussed healthy weight loss we appropriate she does like to avoid medications. She is still declining vaccinations. We will order DEXA scan for screening bone density discussed with patient reasoning for screening. Discussed colon cancer screening she will let us know. -Patient advised to return or notify health care team  if  new concerns arise.  Patient Instructions   Advise take the hctz every day  Your last potassium was good  .   Make appt for DEXA scan at the [facility]  ( [address])   Agree with ehalthy wegiht loss to help  asthma blood pressure blood vessel health and  Cholesterol.  Let us know if you change your mind about vaccines.   Or colon cancer screening  Condolences on the death of your Daughter.   Make suyre bp is below 140/90 range . If all ok then see you in a year .      Bone Density Test The bone density test uses  a special type of X-ray to measure the amount of calcium and other minerals in your bones. It can measure bone density in the hip and the spine. The test procedure is similar to having a regular X-ray. This test may also be called:  Bone densitometry.  Bone mineral density test.  Dual-energy X-ray absorptiometry (DEXA). You may have this test to:  Diagnose a condition that causes weak or thin bones (osteoporosis).  Screen you for osteoporosis.  Predict your risk for a broken bone (fracture).  Determine how well your osteoporosis treatment is working. Tell a health care provider about:  Any allergies you have.  All medicines you are taking, including vitamins, herbs, eye drops, creams, and over-the-counter medicines.  Any problems you or family members have had with anesthetic medicines.  Any blood disorders you have.  Any surgeries you have had.  Any medical conditions you have.  Whether you are pregnant or may be pregnant.  Any medical tests you have had within the past 14 days that used contrast material. What are the risks? Generally, this is a safe procedure. However, it does expose you to a small amount of radiation, which can slightly increase your cancer risk. What happens before the procedure?  Do not take any calcium supplements starting 24 hours before your test.  Remove all metal jewelry, eyeglasses, dental appliances, and any other metal objects. What happens during the procedure?   You will lie down on an exam table. There will be an X-ray generator below you and an imaging device above you.  Other devices, such as boxes or braces, may be used to position your body properly for the scan.  The machine will slowly scan your body. You will need to keep still.  The images will show up on a screen in the  room. Images will be examined by a specialist after your test is done. The procedure may vary among health care providers and hospitals. What happens after  the procedure?  It is up to you to get your test results. Ask your health care provider, or the department that is doing the test, when your results will be ready. Summary  A bone density test is an imaging test that uses a type of X-ray to measure the amount of calcium and other minerals in your bones.  The test may be used to diagnose or screen you for a condition that causes weak or thin bones (osteoporosis), predict your risk for a broken bone (fracture), or determine how well your osteoporosis treatment is working.  Do not take any calcium supplements starting 24 hours before your test.  Ask your health care provider, or the department that is doing the test, when your results will be ready. This information is not intended to replace advice given to you by your health care provider. Make sure you discuss any questions you have with your health care provider. Document Released: 10/25/2004 Document Revised: 10/19/2017 Document Reviewed: 08/07/2017 Elsevier Patient Education  2020 [employer].     Jill Burton [place] M.D.

## 2019-08-30 ENCOUNTER — Other Ambulatory Visit: Payer: Self-pay

## 2019-08-30 ENCOUNTER — Ambulatory Visit (INDEPENDENT_AMBULATORY_CARE_PROVIDER_SITE_OTHER): Payer: Medicare Other | Admitting: Internal Medicine

## 2019-08-30 ENCOUNTER — Encounter: Payer: Self-pay | Admitting: Internal Medicine

## 2019-08-30 VITALS — BP 132/68 | HR 85 | Temp 97.6°F | Wt 233.8 lb

## 2019-08-30 DIAGNOSIS — Z79899 Other long term (current) drug therapy: Secondary | ICD-10-CM | POA: Diagnosis not present

## 2019-08-30 DIAGNOSIS — Z2821 Immunization not carried out because of patient refusal: Secondary | ICD-10-CM

## 2019-08-30 DIAGNOSIS — G479 Sleep disorder, unspecified: Secondary | ICD-10-CM | POA: Diagnosis not present

## 2019-08-30 DIAGNOSIS — E785 Hyperlipidemia, unspecified: Secondary | ICD-10-CM

## 2019-08-30 DIAGNOSIS — J45909 Unspecified asthma, uncomplicated: Secondary | ICD-10-CM | POA: Diagnosis not present

## 2019-08-30 DIAGNOSIS — E2839 Other primary ovarian failure: Secondary | ICD-10-CM

## 2019-08-30 DIAGNOSIS — I10 Essential (primary) hypertension: Secondary | ICD-10-CM | POA: Diagnosis not present

## 2019-08-30 DIAGNOSIS — Z5189 Encounter for other specified aftercare: Secondary | ICD-10-CM | POA: Diagnosis not present

## 2019-08-30 NOTE — Patient Instructions (Addendum)
Advise take the hctz every day  Your last potassium was good  .   Make appt for DEXA scan at the [place] [facility] [facility]  ( [address])   Agree with ehalthy wegiht loss to help  asthma blood pressure blood vessel health and  Cholesterol.  Let us know if you change your mind about vaccines.   Or colon cancer screening  Condolences on the death of your Daughter.   Make suyre bp is below 140/90 range . If all ok then see you in a year .      Bone Density Test The bone density test uses a special type of X-ray to measure the amount of calcium and other minerals in your bones. It can measure bone density in the hip and the spine. The test procedure is similar to having a regular X-ray. This test may also be called:  Bone densitometry.  Bone mineral density test.  Dual-energy X-ray absorptiometry (DEXA). You may have this test to:  Diagnose a condition that causes weak or thin bones (osteoporosis).  Screen you for osteoporosis.  Predict your risk for a broken bone (fracture).  Determine how well your osteoporosis treatment is working. Tell a health care provider about:  Any allergies you have.  All medicines you are taking, including vitamins, herbs, eye drops, creams, and over-the-counter medicines.  Any problems you or family members have had with anesthetic medicines.  Any blood disorders you have.  Any surgeries you have had.  Any medical conditions you have.  Whether you are pregnant or may be pregnant.  Any medical tests you have had within the past 14 days that used contrast material. What are the risks? Generally, this is a safe procedure. However, it does expose you to a small amount of radiation, which can slightly increase your cancer risk. What happens before the procedure?  Do not take any calcium supplements starting 24 hours before your test.  Remove all metal jewelry, eyeglasses, dental appliances, and any other metal objects. What happens during the  procedure?   You will lie down on an exam table. There will be an X-ray generator below you and an imaging device above you.  Other devices, such as boxes or braces, may be used to position your body properly for the scan.  The machine will slowly scan your body. You will need to keep still.  The images will show up on a screen in the room. Images will be examined by a specialist after your test is done. The procedure may vary among health care providers and hospitals. What happens after the procedure?  It is up to you to get your test results. Ask your health care provider, or the department that is doing the test, when your results will be ready. Summary  A bone density test is an imaging test that uses a type of X-ray to measure the amount of calcium and other minerals in your bones.  The test may be used to diagnose or screen you for a condition that causes weak or thin bones (osteoporosis), predict your risk for a broken bone (fracture), or determine how well your osteoporosis treatment is working.  Do not take any calcium supplements starting 24 hours before your test.  Ask your health care provider, or the department that is doing the test, when your results will be ready. This information is not intended to replace advice given to you by your health care provider. Make sure you discuss any questions you have with  your health care provider. Document Released: 10/25/2004 Document Revised: 10/19/2017 Document Reviewed: 08/07/2017 Elsevier Patient Education  2020 [employer].

## 2019-09-10 NOTE — Progress Notes (Signed)
Nothing needed. 

## 2019-09-30 ENCOUNTER — Encounter: Payer: Self-pay | Admitting: Internal Medicine

## 2019-12-18 ENCOUNTER — Telehealth: Payer: Self-pay | Admitting: *Deleted

## 2019-12-18 NOTE — Telephone Encounter (Signed)
Spoke with patient to offer her an appt with Dr.burchette today pt states that she feels fine today and does not want to see anybody other then Dr.Panosh and that she wants a physical tried to schedule pt for physical at 3 next Wednesday pt states she will call back to schedule

## 2019-12-18 NOTE — Telephone Encounter (Signed)
Lvm for pt to call back when pt calls back please transfer to me

## 2019-12-18 NOTE — Telephone Encounter (Signed)
Patient called after hours line last night. Patient reports was having heart palpitations on and off . She last had them yesterday . She wants to schedule an appointment.

## 2020-01-06 DIAGNOSIS — L309 Dermatitis, unspecified: Secondary | ICD-10-CM | POA: Diagnosis not present

## 2020-01-06 DIAGNOSIS — I10 Essential (primary) hypertension: Secondary | ICD-10-CM | POA: Diagnosis not present

## 2020-01-06 DIAGNOSIS — J301 Allergic rhinitis due to pollen: Secondary | ICD-10-CM | POA: Diagnosis not present

## 2020-01-06 DIAGNOSIS — E559 Vitamin D deficiency, unspecified: Secondary | ICD-10-CM | POA: Diagnosis not present

## 2020-01-06 DIAGNOSIS — J4520 Mild intermittent asthma, uncomplicated: Secondary | ICD-10-CM | POA: Diagnosis not present

## 2020-01-23 DIAGNOSIS — E559 Vitamin D deficiency, unspecified: Secondary | ICD-10-CM | POA: Diagnosis not present

## 2020-01-23 DIAGNOSIS — E782 Mixed hyperlipidemia: Secondary | ICD-10-CM | POA: Diagnosis not present

## 2020-01-23 DIAGNOSIS — R7989 Other specified abnormal findings of blood chemistry: Secondary | ICD-10-CM | POA: Diagnosis not present

## 2020-01-23 DIAGNOSIS — R5383 Other fatigue: Secondary | ICD-10-CM | POA: Diagnosis not present

## 2020-01-23 DIAGNOSIS — R7982 Elevated C-reactive protein (CRP): Secondary | ICD-10-CM | POA: Diagnosis not present

## 2020-02-03 DIAGNOSIS — J300 Vasomotor rhinitis: Secondary | ICD-10-CM | POA: Diagnosis not present

## 2020-02-03 DIAGNOSIS — J4540 Moderate persistent asthma, uncomplicated: Secondary | ICD-10-CM | POA: Diagnosis not present

## 2020-02-03 DIAGNOSIS — L2089 Other atopic dermatitis: Secondary | ICD-10-CM | POA: Diagnosis not present

## 2020-02-21 ENCOUNTER — Other Ambulatory Visit: Payer: Self-pay | Admitting: Internal Medicine

## 2020-02-21 DIAGNOSIS — Z0000 Encounter for general adult medical examination without abnormal findings: Secondary | ICD-10-CM | POA: Diagnosis not present

## 2020-02-21 DIAGNOSIS — J301 Allergic rhinitis due to pollen: Secondary | ICD-10-CM | POA: Diagnosis not present

## 2020-02-21 DIAGNOSIS — E2839 Other primary ovarian failure: Secondary | ICD-10-CM

## 2020-03-09 ENCOUNTER — Other Ambulatory Visit: Payer: Medicare Other

## 2020-05-07 ENCOUNTER — Other Ambulatory Visit: Payer: Medicare Other

## 2020-07-24 ENCOUNTER — Telehealth: Payer: Self-pay | Admitting: Internal Medicine

## 2020-07-24 NOTE — Telephone Encounter (Signed)
NUMBER NOT IN SERVICE. Pt due to schedule Medicare Annual Wellness Visit (AWV) either virtually or in office.  Last AWV 06/03/15; please schedule at anytime with [facility] Nurse Health Advisor 2.  This should be a 45 minute visit.

## 2020-08-04 DIAGNOSIS — R7303 Prediabetes: Secondary | ICD-10-CM | POA: Diagnosis not present

## 2020-08-04 DIAGNOSIS — E782 Mixed hyperlipidemia: Secondary | ICD-10-CM | POA: Diagnosis not present

## 2020-08-04 DIAGNOSIS — R5383 Other fatigue: Secondary | ICD-10-CM | POA: Diagnosis not present

## 2020-08-04 DIAGNOSIS — E559 Vitamin D deficiency, unspecified: Secondary | ICD-10-CM | POA: Diagnosis not present

## 2020-08-25 DIAGNOSIS — M25511 Pain in right shoulder: Secondary | ICD-10-CM | POA: Diagnosis not present

## 2020-08-25 DIAGNOSIS — I10 Essential (primary) hypertension: Secondary | ICD-10-CM | POA: Diagnosis not present

## 2020-08-25 DIAGNOSIS — E559 Vitamin D deficiency, unspecified: Secondary | ICD-10-CM | POA: Diagnosis not present

## 2020-08-25 DIAGNOSIS — G8929 Other chronic pain: Secondary | ICD-10-CM | POA: Diagnosis not present

## 2020-10-12 ENCOUNTER — Encounter: Payer: Medicare Other | Attending: Internal Medicine | Admitting: Registered"

## 2020-10-12 ENCOUNTER — Encounter: Payer: Self-pay | Admitting: Registered"

## 2020-10-12 ENCOUNTER — Other Ambulatory Visit: Payer: Self-pay

## 2020-10-12 VITALS — Ht 62.0 in | Wt 226.4 lb

## 2020-10-12 DIAGNOSIS — G4700 Insomnia, unspecified: Secondary | ICD-10-CM | POA: Insufficient documentation

## 2020-10-12 DIAGNOSIS — I10 Essential (primary) hypertension: Secondary | ICD-10-CM | POA: Insufficient documentation

## 2020-10-12 DIAGNOSIS — R7303 Prediabetes: Secondary | ICD-10-CM | POA: Diagnosis not present

## 2020-10-12 DIAGNOSIS — Z713 Dietary counseling and surveillance: Secondary | ICD-10-CM | POA: Insufficient documentation

## 2020-10-12 NOTE — Patient Instructions (Signed)
Consider the following changes  Sleep: soft music, nature sounds, background noise when you need help to stay asleep. Increase water intake earlier in the day to decrease having to get up during. Drinking more water with your morning pills. Increase fiber intake: beans, vegetables, berries Decrease cheese in your diet Decrease your Snapple intake: look for infused water recipes

## 2020-10-12 NOTE — Progress Notes (Signed)
Medical Nutrition Therapy  Appointment Start time:  0930  Appointment End time:  1030  Primary concerns today: prediabetes and constipation  Referral diagnosis: R73.03 (prediabetes) and E66.01 (obesity) Preferred learning style: no preference indicated Learning readiness: ready, change in progress   NUTRITION ASSESSMENT   Anthropometrics  Wt Readings from Last 3 Encounters:  10/12/20 226 lb 6.4 oz (102.7 kg)  08/30/19 233 lb 12.8 oz (106.1 kg)  10/04/18 222 lb 1.6 oz (100.7 kg)   Clinical Medical Hx: HTN, abn TSH, insomnia  Medications: reviewed Labs: A1c 5.9% (Nov 2021) Notable Signs/Symptoms: constipation, insomnia   Lifestyle & Dietary Hx Pt states she doesn't eat a lot of sweets but when in grocery store will get some candy, but is getting better at not doing that. Pt states she is taking supplements to help with cholesterol and blood sugar.  Patient states she doesn't get much exercise, doesn't walk outside because she is afraid of dogs. Pt reports she has a treadmill set up where she can watch TV but rarely gets on it.  Pt states she does not drink tap water, will be Fuji water or Kangen water filter. Pt states she has a case of Snapple and was surprised to learn it was high in sugar because it was labeled "all natural"  Estimated daily fluid intake: 40 oz Supplements: melatonin occasionally, Vit C, D, B-12, Synergy Tabs (vitamins) Sleep: 4 hrs Stress / self-care: low stress, retired from [employer] last year Current average weekly physical activity: ADLs  24-Hr Dietary Recall First Meal: coffee, 1 Tbs raw sugar, pretzels to prevent nausea. 1x week plain biscuit Snack: none Second Meal: grilled cheese, white butter bread, snapple OR bacon, eggs, grits Snack: none Third Meal: stewed tomatoes, biscuits OR left overs banana pudding, flounder, baked beans, with added bacon grease and sugar OR chicken alfredo Snack: smoothie with fruit and protein powder around 4-5 am. Then  goes back to sleep Beverages: coffee, Snapple, water  Pt states she eats fruit all the time, but could not get an idea where it fits in besides smoothie.  Estimated Energy Needs Calories: 1700   NUTRITION DIAGNOSIS  NI-5.8.5 Inadeqate fiber intake As related to limited whole grains and vegetable intake.  As evidenced by dietary recall and constipation.   NUTRITION INTERVENTION  Nutrition education (E-1) on the following topics:  . Insulin and glucose . A1c lab . Fiber, water & exercise role in bowel health  Handouts Provided Include   Sleep Hygiene  A1c chart  How to Thrive: diabetes book  Learning Style & Readiness for Change Teaching method utilized: Visual & Auditory  Demonstrated degree of understanding via: Teach Back  Barriers to learning/adherence to lifestyle change: none  Goals Established by Pt Sleep: soft music, nature sounds, background noise when you need help to stay asleep. Increase water intake earlier in the day to decrease having to get up during. Drinking more water with your morning pills. Increase fiber intake: beans, vegetables, berries Decrease cheese in your diet Decrease your Snapple intake: look for infused water recipes   MONITORING & EVALUATION Dietary intake, weekly physical activity, constipation status and weight in 2 months.  Next Steps  Patient is to work on goals and return for continued education. Will likely talk about bread at next visit.

## 2020-11-17 ENCOUNTER — Telehealth: Payer: Self-pay | Admitting: Internal Medicine

## 2020-11-17 NOTE — Telephone Encounter (Signed)
Left message for patient to call back and schedule Medicare Annual Wellness Visit (AWV) either virtually or in office.   Last AWV 06/03/15  please schedule at anytime with [facility] Nurse Health Advisor 1 or 2   This should be a 45 minute visit. Also needs appointment with pcp last appointment 08/30/19

## 2020-12-03 ENCOUNTER — Encounter: Payer: Self-pay | Admitting: Family Medicine

## 2020-12-03 ENCOUNTER — Ambulatory Visit (INDEPENDENT_AMBULATORY_CARE_PROVIDER_SITE_OTHER): Payer: Medicare Other | Admitting: Family Medicine

## 2020-12-03 ENCOUNTER — Other Ambulatory Visit: Payer: Self-pay

## 2020-12-03 VITALS — BP 120/76 | HR 71 | Temp 98.0°F | Wt 230.0 lb

## 2020-12-03 DIAGNOSIS — R109 Unspecified abdominal pain: Secondary | ICD-10-CM | POA: Diagnosis not present

## 2020-12-03 DIAGNOSIS — S29011A Strain of muscle and tendon of front wall of thorax, initial encounter: Secondary | ICD-10-CM

## 2020-12-03 LAB — POC URINALSYSI DIPSTICK (AUTOMATED)
Bilirubin, UA: NEGATIVE
Blood, UA: NEGATIVE
Glucose, UA: NEGATIVE
Ketones, UA: NEGATIVE
Leukocytes, UA: NEGATIVE
Nitrite, UA: NEGATIVE
Protein, UA: NEGATIVE
Spec Grav, UA: 1.02 (ref 1.010–1.025)
Urobilinogen, UA: 0.2 E.U./dL
pH, UA: 6 (ref 5.0–8.0)

## 2020-12-03 NOTE — Progress Notes (Signed)
Subjective:    Patient ID: Jill Burton, female    DOB: 06-16-1946, 75 y.o.   MRN: [id number]  No chief complaint on file.   HPI Patient was seen today for acute concern.  Pt endorses R lateral back pain x 1 wk.  Symptoms started after lifting a suitcase up.  Pt states pain is intermittent, worse with laying down and getting up in the am.  Pt denies pain that moves, n/v, suprapubic pain, fever, chills, dysuria, hematuria.  Drinking 3-4 bottles of water per day and 1-2 cups of coffee.  Pt wanted to make sure she did not have a UTI.  Pt inquires about her HCTZ rx.  States the pills changed color and size and do not seem to be working as well for her fluid in hands.  Pt has a rx bottle for HCTZ 12.5 mg tabs with her.    Past Medical History:  Diagnosis Date  . Allergic rhinitis    allergy testing neg in 2011  . Anemia   . Asthma   . Hyperlipidemia    no meds  . Hypertension    does not take bp meds on a regular basis  . SVD (spontaneous vaginal delivery)    x 1    Allergies  Allergen Reactions  . Penicillins   . Sulfonamide Derivatives     ROS General: Denies fever, chills, night sweats, changes in weight, changes in appetite HEENT: Denies headaches, ear pain, changes in vision, rhinorrhea, sore throat CV: Denies CP, palpitations, SOB, orthopnea Pulm: Denies SOB, cough, wheezing GI: Denies abdominal pain, nausea, vomiting, diarrhea, constipation GU: Denies dysuria, hematuria, frequency, vaginal discharge Msk: Denies muscle cramps, joint pains  +R lateral back/flank pain Neuro: Denies weakness, numbness, tingling Skin: Denies rashes, bruising Psych: Denies depression, anxiety, hallucinations     Objective:    Blood pressure 120/76, pulse 71, temperature 98 F (36.7 C), temperature source Oral, weight 230 lb (104.3 kg), SpO2 98 %.  Gen. Pleasant, well-nourished, in no distress, normal affect   HEENT: [place]/AT, face symmetric, conjunctiva clear, no scleral icterus, PERRLA,  EOMI, nares patent without drainage Lungs: no accessory muscle use Cardiovascular: RRR, no peripheral edema Musculoskeletal: No TTP of cervical, thoracic, lumbar, or paraspinal muscles.  Extension, flexion, lateral movements of spine without pain.  TTP of right flank lower intercostal muscle midline.  no deformities, no cyanosis or clubbing, normal tone Neuro:  A&Ox3, CN II-XII intact, normal gait Skin:  Warm, no lesions/ rash   Wt Readings from Last 3 Encounters:  10/12/20 226 lb 6.4 oz (102.7 kg)  08/30/19 233 lb 12.8 oz (106.1 kg)  10/04/18 222 lb 1.6 oz (100.7 kg)    Lab Results  Component Value Date   WBC 7.0 12/13/2016   HGB 14.6 12/13/2016   HCT 44.5 12/13/2016   PLT 212.0 12/13/2016   GLUCOSE 87 09/19/2018   CHOL 276 (A) 07/22/2019   TRIG 140 07/22/2019   HDL 74 (A) 07/22/2019   LDLDIRECT 157.4 09/23/2013   LDLCALC 177 07/22/2019   ALT 18 07/22/2019   AST 21 07/22/2019   NA 141 07/22/2019   K 4.6 07/22/2019   CL 103 07/22/2019   CREATININE 0.9 07/22/2019   BUN 13 07/22/2019   CO2 25 (A) 07/22/2019   TSH 1.28 07/22/2019   HGBA1C 6.1 07/22/2019    Assessment/Plan:  Intercostal muscle strain, initial encounter -Discussed supportive care including heat, massage, topical analgesic, NSAIDs or Tylenol as needed -Can also do stretching exercises -Given precautions -Continue  to monitor  Flank pain -UA negative.  SG 1.020 - Plan: POCT Urinalysis Dipstick (Automated)  F/u prn.  Abbe Amsterdam, MD

## 2020-12-25 ENCOUNTER — Ambulatory Visit: Admitting: Registered"

## 2021-01-07 DIAGNOSIS — D513 Other dietary vitamin B12 deficiency anemia: Secondary | ICD-10-CM | POA: Diagnosis not present

## 2021-01-07 DIAGNOSIS — R7303 Prediabetes: Secondary | ICD-10-CM | POA: Diagnosis not present

## 2021-01-07 DIAGNOSIS — E782 Mixed hyperlipidemia: Secondary | ICD-10-CM | POA: Diagnosis not present

## 2021-01-07 DIAGNOSIS — R5383 Other fatigue: Secondary | ICD-10-CM | POA: Diagnosis not present

## 2021-01-07 DIAGNOSIS — E612 Magnesium deficiency: Secondary | ICD-10-CM | POA: Diagnosis not present

## 2021-01-07 DIAGNOSIS — E279 Disorder of adrenal gland, unspecified: Secondary | ICD-10-CM | POA: Diagnosis not present

## 2021-01-07 DIAGNOSIS — E611 Iron deficiency: Secondary | ICD-10-CM | POA: Diagnosis not present

## 2021-01-07 DIAGNOSIS — E559 Vitamin D deficiency, unspecified: Secondary | ICD-10-CM | POA: Diagnosis not present

## 2021-01-07 DIAGNOSIS — D509 Iron deficiency anemia, unspecified: Secondary | ICD-10-CM | POA: Diagnosis not present

## 2021-02-02 DIAGNOSIS — J4540 Moderate persistent asthma, uncomplicated: Secondary | ICD-10-CM | POA: Diagnosis not present

## 2021-02-02 DIAGNOSIS — L2089 Other atopic dermatitis: Secondary | ICD-10-CM | POA: Diagnosis not present

## 2021-02-02 DIAGNOSIS — J300 Vasomotor rhinitis: Secondary | ICD-10-CM | POA: Diagnosis not present

## 2021-02-16 ENCOUNTER — Other Ambulatory Visit: Payer: Self-pay

## 2021-02-16 ENCOUNTER — Ambulatory Visit (INDEPENDENT_AMBULATORY_CARE_PROVIDER_SITE_OTHER): Payer: Medicare Other

## 2021-02-16 VITALS — BP 124/76 | HR 69 | Temp 98.0°F | Resp 20 | Wt 224.5 lb

## 2021-02-16 DIAGNOSIS — Z0000 Encounter for general adult medical examination without abnormal findings: Secondary | ICD-10-CM | POA: Diagnosis not present

## 2021-02-16 NOTE — Patient Instructions (Addendum)
Ms. Jill Burton , Thank you for taking time to come for your Medicare Wellness Visit. I appreciate your ongoing commitment to your health goals. Please review the following plan we discussed and let me know if I can assist you in the future.   Screening recommendations/referrals: Colonoscopy: Declined  Mammogram: Declined Bone Density: Declined Recommended yearly ophthalmology/optometry visit for glaucoma screening and checkup Recommended yearly dental visit for hygiene and checkup  Vaccinations: Influenza vaccine: Declined  Pneumococcal vaccine: Due and discussed Tdap vaccine: Due and discussed Shingles vaccine: Shingrix discussed. Please contact your pharmacy for coverage information.    Covid-19:Declined  Advanced directives: Please bring a copy of your health care power of attorney and living will to the office at your convenience.  Conditions/risks identified: Lose weight   Next appointment: Follow up in one year for your annual wellness visit    Preventive Care 65 Years and Older, Female Preventive care refers to lifestyle choices and visits with your health care provider that can promote health and wellness. What does preventive care include?  A yearly physical exam. This is also called an annual well check.  Dental exams once or twice a year.  Routine eye exams. Ask your health care provider how often you should have your eyes checked.  Personal lifestyle choices, including:  Daily care of your teeth and gums.  Regular physical activity.  Eating a healthy diet.  Avoiding tobacco and drug use.  Limiting alcohol use.  Practicing safe sex.  Taking low-dose aspirin every day.  Taking vitamin and mineral supplements as recommended by your health care provider. What happens during an annual well check? The services and screenings done by your health care provider during your annual well check will depend on your age, overall health, lifestyle risk factors, and family  history of disease. Counseling  Your health care provider may ask you questions about your:  Alcohol use.  Tobacco use.  Drug use.  Emotional well-being.  Home and relationship well-being.  Sexual activity.  Eating habits.  History of falls.  Memory and ability to understand (cognition).  Work and work Astronomer.  Reproductive health. Screening  You may have the following tests or measurements:  Height, weight, and BMI.  Blood pressure.  Lipid and cholesterol levels. These may be checked every 5 years, or more frequently if you are over 31 years old.  Skin check.  Lung cancer screening. You may have this screening every year starting at age 51 if you have a 30-pack-year history of smoking and currently smoke or have quit within the past 15 years.  Fecal occult blood test (FOBT) of the stool. You may have this test every year starting at age 23.  Flexible sigmoidoscopy or colonoscopy. You may have a sigmoidoscopy every 5 years or a colonoscopy every 10 years starting at age 81.  Hepatitis C blood test.  Hepatitis B blood test.  Sexually transmitted disease (STD) testing.  Diabetes screening. This is done by checking your blood sugar (glucose) after you have not eaten for a while (fasting). You may have this done every 1-3 years.  Bone density scan. This is done to screen for osteoporosis. You may have this done starting at age 10.  Mammogram. This may be done every 1-2 years. Talk to your health care provider about how often you should have regular mammograms. Talk with your health care provider about your test results, treatment options, and if necessary, the need for more tests. Vaccines  Your health care provider may recommend  certain vaccines, such as:  Influenza vaccine. This is recommended every year.  Tetanus, diphtheria, and acellular pertussis (Tdap, Td) vaccine. You may need a Td booster every 10 years.  Zoster vaccine. You may need this after  age 64.  Pneumococcal 13-valent conjugate (PCV13) vaccine. One dose is recommended after age 37.  Pneumococcal polysaccharide (PPSV23) vaccine. One dose is recommended after age 63. Talk to your health care provider about which screenings and vaccines you need and how often you need them. This information is not intended to replace advice given to you by your health care provider. Make sure you discuss any questions you have with your health care provider. Document Released: 10/30/2015 Document Revised: 06/22/2016 Document Reviewed: 08/04/2015 Elsevier Interactive Patient Education  2017 [place] Prevention in the Home Falls can cause injuries. They can happen to people of all ages. There are many things you can do to make your home safe and to help prevent falls. What can I do on the outside of my home?  Regularly fix the edges of walkways and driveways and fix any cracks.  Remove anything that might make you trip as you walk through a door, such as a raised step or threshold.  Trim any bushes or trees on the path to your home.  Use bright outdoor lighting.  Clear any walking paths of anything that might make someone trip, such as rocks or tools.  Regularly check to see if handrails are loose or broken. Make sure that both sides of any steps have handrails.  Any raised decks and porches should have guardrails on the edges.  Have any leaves, snow, or ice cleared regularly.  Use sand or salt on walking paths during winter.  Clean up any spills in your garage right away. This includes oil or grease spills. What can I do in the bathroom?  Use night lights.  Install grab bars by the toilet and in the tub and shower. Do not use towel bars as grab bars.  Use non-skid mats or decals in the tub or shower.  If you need to sit down in the shower, use a plastic, non-slip stool.  Keep the floor dry. Clean up any water that spills on the floor as soon as it  happens.  Remove soap buildup in the tub or shower regularly.  Attach bath mats securely with double-sided non-slip rug tape.  Do not have throw rugs and other things on the floor that can make you trip. What can I do in the bedroom?  Use night lights.  Make sure that you have a light by your bed that is easy to reach.  Do not use any sheets or blankets that are too big for your bed. They should not hang down onto the floor.  Have a firm chair that has side arms. You can use this for support while you get dressed.  Do not have throw rugs and other things on the floor that can make you trip. What can I do in the kitchen?  Clean up any spills right away.  Avoid walking on wet floors.  Keep items that you use a lot in easy-to-reach places.  If you need to reach something above you, use a strong step stool that has a grab bar.  Keep electrical cords out of the way.  Do not use floor polish or wax that makes floors slippery. If you must use wax, use non-skid floor wax.  Do not have throw rugs and other  things on the floor that can make you trip. What can I do with my stairs?  Do not leave any items on the stairs.  Make sure that there are handrails on both sides of the stairs and use them. Fix handrails that are broken or loose. Make sure that handrails are as long as the stairways.  Check any carpeting to make sure that it is firmly attached to the stairs. Fix any carpet that is loose or worn.  Avoid having throw rugs at the top or bottom of the stairs. If you do have throw rugs, attach them to the floor with carpet tape.  Make sure that you have a light switch at the top of the stairs and the bottom of the stairs. If you do not have them, ask someone to add them for you. What else can I do to help prevent falls?  Wear shoes that:  Do not have high heels.  Have rubber bottoms.  Are comfortable and fit you well.  Are closed at the toe. Do not wear sandals.  If you  use a stepladder:  Make sure that it is fully opened. Do not climb a closed stepladder.  Make sure that both sides of the stepladder are locked into place.  Ask someone to hold it for you, if possible.  Clearly mark and make sure that you can see:  Any grab bars or handrails.  First and last steps.  Where the edge of each step is.  Use tools that help you move around (mobility aids) if they are needed. These include:  Canes.  Walkers.  Scooters.  Crutches.  Turn on the lights when you go into a dark area. Replace any light bulbs as soon as they burn out.  Set up your furniture so you have a clear path. Avoid moving your furniture around.  If any of your floors are uneven, fix them.  If there are any pets around you, be aware of where they are.  Review your medicines with your doctor. Some medicines can make you feel dizzy. This can increase your chance of falling. Ask your doctor what other things that you can do to help prevent falls. This information is not intended to replace advice given to you by your health care provider. Make sure you discuss any questions you have with your health care provider. Document Released: 07/30/2009 Document Revised: 03/10/2016 Document Reviewed: 11/07/2014 Elsevier Interactive Patient Education  2017 [employer].

## 2021-02-16 NOTE — Progress Notes (Signed)
Subjective:   Jill Burton is a 75 y.o. female who presents for Medicare Annual (Subsequent) preventive examination.  Review of Systems     Cardiac Risk Factors include: advanced age (>51men, >56 women);hypertension;obesity (BMI >30kg/m2)     Objective:    Today's Vitals   02/16/21 0935  BP: 124/76  Pulse: 69  Resp: 20  Temp: 98 F (36.7 C)  SpO2: 97%  Weight: 224 lb 8 oz (101.8 kg)   Body mass index is 41.06 kg/m.  Advanced Directives 02/16/2021 10/12/2020 11/19/2013  Does Patient Have a Medical Advance Directive? Yes Yes Patient does not have advance directive  Type of Advance Directive Living will - -  Does patient want to make changes to medical advance directive? - No - Patient declined -    Current Medications (verified) Outpatient Encounter Medications as of 02/16/2021  Medication Sig  . Albuterol Sulfate (PROAIR HFA IN) Inhale into the lungs.  . fluticasone-salmeterol (ADVAIR DISKUS) 250-50 MCG/ACT AEPB Inhale 1 puff into the lungs in the morning and at bedtime.  . hydrochlorothiazide (MICROZIDE) 12.5 MG capsule Take 12.5 mg by mouth daily.  . magnesium 30 MG tablet Take 30 mg by mouth 2 (two) times daily.  . vitamin C (ASCORBIC ACID) 500 MG tablet Take 1,000 mg by mouth daily.  . [DISCONTINUED] albuterol (PROVENTIL) (2.5 MG/3ML) 0.083% nebulizer solution INHALE CONTENTS OF 1 VIAL VIA NEBULIZER EVERY 6 HOURS AS NEEDED FOR WHEEZING OR SHORTNESS OF BREATH  . [DISCONTINUED] albuterol (VENTOLIN HFA) 108 (90 Base) MCG/ACT inhaler ProAir HFA 90 mcg/actuation aerosol inhaler  INHALE 2 PUFFS EVERY 4 HOURS AS NEEDED  . [DISCONTINUED] Ascorbic Acid (VITAMIN C) 1000 MG tablet Take 1,000 mg by mouth daily.  . [DISCONTINUED] Cyanocobalamin (VITAMIN B 12 PO) Take by mouth.  . [DISCONTINUED] Fluticasone-Salmeterol (ADVAIR DISKUS) 250-50 MCG/DOSE AEPB USE 1 PUFF AS NEEDED  . [DISCONTINUED] hydrochlorothiazide (HYDRODIURIL) 25 MG tablet Take 1 tablet (25 mg total) by mouth as  needed.  . [DISCONTINUED] hydrocortisone 2.5 % cream Apply topically 2 (two) times daily. If needed for dermatitis limit use  . [DISCONTINUED] montelukast (SINGULAIR) 10 MG tablet TAKE 1 TABLET (10 MG TOTAL) BY MOUTH AT BEDTIME.  . [DISCONTINUED] PROAIR HFA 108 (90 BASE) MCG/ACT inhaler INHALE 2 PUFFS EVERY 4 HOURS AS NEEDED  . Cholecalciferol (VITAMIN D3) 250 MCG (10000 UT) capsule See admin instructions.  Marland Kitchen DHEA 10 MG TABS See admin instructions.  . melatonin 3 MG TABS tablet 1 tablet at bedtime as needed  . [DISCONTINUED] methocarbamol (ROBAXIN) 500 MG tablet Take 1-2 tablets (500-1,000 mg total) by mouth every 6 (six) hours as needed for muscle spasms. (Patient not taking: Reported on 06/03/2019)  . [DISCONTINUED] nabumetone (RELAFEN) 750 MG tablet Take 1 tablet (750 mg total) by mouth 2 (two) times daily as needed.  . [DISCONTINUED] Probiotic Product (RESTORA) CAPS Take 1 capsule by mouth daily.  . [DISCONTINUED] tiZANidine (ZANAFLEX) 2 MG tablet Take 1-2 tablets (2-4 mg total) by mouth at bedtime as needed for muscle spasms. (Patient not taking: Reported on 08/30/2019)   No facility-administered encounter medications on file as of 02/16/2021.    Allergies (verified) Penicillins and Sulfonamide derivatives   History: Past Medical History:  Diagnosis Date  . Allergic rhinitis    allergy testing neg in 2011  . Anemia   . Asthma   . Hyperlipidemia    no meds  . Hypertension    does not take bp meds on a regular basis  . SVD (spontaneous vaginal  delivery)    x 1   Past Surgical History:  Procedure Laterality Date  . CESAREAN SECTION     x3  . DILITATION & CURRETTAGE/HYSTROSCOPY WITH VERSAPOINT RESECTION N/A 11/21/2013   Procedure: DILATATION & CURETTAGE/HYSTEROSCOPY WITH VERSAPOINT RESECTION;  Surgeon: Serita KyleSheronette A Cousins, MD;  Location: [facility];  Service: Gynecology;  Laterality: N/A;  . HERNIA REPAIR     as child  . TUBAL LIGATION     Family History  Problem Relation Age of  Onset  . Asthma Other    Social History   Socioeconomic History  . Marital status: Married    Spouse name: Not on file  . Number of children: Not on file  . Years of education: Not on file  . Highest education level: Not on file  Occupational History  . Not on file  Tobacco Use  . Smoking status: Never Smoker  . Smokeless tobacco: Never Used  Substance and Sexual Activity  . Alcohol use: No  . Drug use: No  . Sexual activity: Yes    Birth control/protection: Surgical  Other Topics Concern  . 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      hh of 2    Social Determinants of Health   Financial Resource Strain: Low Risk   . Difficulty of Paying Living Expenses: Not hard at all  Food Insecurity: No Food Insecurity  . Worried About Programme researcher, broadcasting/film/videounning Out of Food in the Last Year: Never true  . Ran Out of Food in the Last Year: Never true  Transportation Needs: No Transportation Needs  . Lack of Transportation (Medical): No  . Lack of Transportation (Non-Medical): No  Physical Activity: Inactive  . Days of Exercise per Week: 0 days  . Minutes of Exercise per Session: 0 min  Stress: No Stress Concern Present  . Feeling of Stress : Not at all  Social Connections: Moderately Integrated  . Frequency of Communication with Friends and Family: Once a week  . Frequency of Social Gatherings with Friends and Family: More than three times a week  . Attends Religious Services: More than 4 times per year  . Active Member of Clubs or Organizations: No  . Attends BankerClub or Organization Meetings: Never  . Marital Status: Married    Tobacco Counseling Counseling given: Not Answered   Clinical Intake:  Pre-visit preparation completed: Yes  Pain : No/denies pain     BMI - recorded: 41.06 Nutritional Status: BMI > 30  Obese Nutritional Risks: None Diabetes: No  How often do you need to have someone  help you when you read instructions, pamphlets, or other written materials from your doctor or pharmacy?: 1 - Never  Diabetic?No  Interpreter Needed?: No  Information entered by :: Lanier Ensignina Sarika Baldini, LPN   Activities of Daily Living In your present state of health, do you have any difficulty performing the following activities: 02/16/2021  Hearing? N  Vision? N  Difficulty concentrating or making decisions? N  Walking or climbing stairs? N  Dressing or bathing? N  Doing errands, shopping? N  Preparing Food and eating ? N  Using the Toilet? N  In the past six months, have you accidently leaked urine? Y  Comment coughing and sneezing wears pad  Do you have problems with loss of bowel control? N  Managing your Medications? N  Managing your Finances? N  Housekeeping or managing your Housekeeping? N  Some recent data might be hidden    Patient Care Team: Panosh, Neta Mends, MD as PCP - General Baxter Hire, MD (Inactive) as Attending Physician (Allergy) Mia Creek, MD as Referring Physician (Internal Medicine)  Indicate any recent Medical Services you may have received from other than [facility] providers in the past year (date may be approximate).     Assessment:   This is a routine wellness examination for Tiki.  Hearing/Vision screen  Hearing Screening   125Hz  250Hz  500Hz  1000Hz  2000Hz  3000Hz  4000Hz  6000Hz  8000Hz   Right ear:           Left ear:           Comments: Pt denies any hearing issues   Vision Screening Comments: Pt follows with [facility] for annual eye exams   Dietary issues and exercise activities discussed: Current Exercise Habits: The patient does not participate in regular exercise at present  Goals Addressed            This Visit's Progress   . Patient Stated       Lose weight       Depression Screen PHQ 2/9 Scores 02/16/2021 10/12/2020 09/19/2018 06/03/2015  PHQ - 2 Score 0 0 0 0    Fall Risk Fall Risk  02/16/2021 10/12/2020 09/19/2018  06/03/2015  Falls in the past year? 0 0 0 No  Number falls in past yr: 0 - - -  Injury with Fall? 0 - - -  Risk for fall due to : Impaired vision - - -  Follow up Falls prevention discussed - - -    FALL RISK PREVENTION PERTAINING TO THE HOME:  Any stairs in or around the home? No  If so, are there any without handrails? No  Home free of loose throw rugs in walkways, pet beds, electrical cords, etc? Yes  Adequate lighting in your home to reduce risk of falls? Yes   ASSISTIVE DEVICES UTILIZED TO PREVENT FALLS:  Life alert? No  Use of a cane, walker or w/c? No  Grab bars in the bathroom? No  Shower chair or bench in shower? No  Elevated toilet seat or a handicapped toilet? No   TIMED UP AND GO:  Was the test performed? Yes .  Length of time to ambulate 10 feet: 10 sec.   Gait steady and fast without use of assistive device  Cognitive Function: Declined         Immunizations  There is no immunization history on file for this patient.  TDAP status: Due, Education has been provided regarding the importance of this vaccine. Advised may receive this vaccine at local pharmacy or Health Dept. Aware to provide a copy of the vaccination record if obtained from local pharmacy or Health Dept. Verbalized acceptance and understanding.  Flu Vaccine status: Declined, Education has been provided regarding the importance of this vaccine but patient still declined. Advised may receive this vaccine at local pharmacy or Health Dept. Aware to provide a copy of the vaccination record if obtained from local pharmacy or Health Dept. Verbalized acceptance and understanding.  Pneumococcal vaccine status: Due, Education has been provided regarding the importance of this vaccine. Advised may receive this vaccine at local pharmacy or Health Dept. Aware to provide a copy of the vaccination record if obtained from local pharmacy or Health Dept. Verbalized acceptance and understanding.  Covid-19 vaccine  status: Declined, Education has been provided regarding the importance of this vaccine  but patient still declined. Advised may receive this vaccine at local pharmacy or Health Dept.or vaccine clinic. Aware to provide a copy of the vaccination record if obtained from local pharmacy or Health Dept. Verbalized acceptance and understanding.  Qualifies for Shingles Vaccine? Yes   Zostavax completed No   Shingrix Completed?: No.    Education has been provided regarding the importance of this vaccine. Patient has been advised to call insurance company to determine out of pocket expense if they have not yet received this vaccine. Advised may also receive vaccine at local pharmacy or Health Dept. Verbalized acceptance and understanding.  Screening Tests Health Maintenance  Topic Date Due  . Hepatitis C Screening  Never done  . COVID-19 Vaccine (1) Never done  . DEXA SCAN  Never done  . MAMMOGRAM  02/16/2022 (Originally 05/27/1997)  . COLONOSCOPY (Pts 45-10yrs Insurance coverage will need to be confirmed)  02/16/2022 (Originally 05/27/1992)  . TETANUS/TDAP  02/16/2022 (Originally 05/27/1966)  . PNA vac Low Risk Adult (1 of 2 - PCV13) 02/16/2022 (Originally 05/27/2012)  . INFLUENZA VACCINE  05/17/2021  . HPV VACCINES  Aged Out    Health Maintenance  Health Maintenance Due  Topic Date Due  . Hepatitis C Screening  Never done  . COVID-19 Vaccine (1) Never done  . DEXA SCAN  Never done    Colorectal cancer screening: No longer required.   Mammogram status: No longer required due to pt request .  Bone density declined   Additional Screening:  Hepatitis C Screening: does qualify;  Vision Screening: Recommended annual ophthalmology exams for early detection of glaucoma and other disorders of the eye. Is the patient up to date with their annual eye exam?  Yes  Who is the provider or what is the name of the office in which the patient attends annual eye exams? [facility] If pt is not established  with a provider, would they like to be referred to a provider to establish care? No .   Dental Screening: Recommended annual dental exams for proper oral hygiene  Community Resource Referral / Chronic Care Management: CRR required this visit?  No   CCM required this visit?  No      Plan:     I have personally reviewed and noted the following in the patient's chart:   . Medical and social history . Use of alcohol, tobacco or illicit drugs  . Current medications and supplements including opioid prescriptions.  . Functional ability and status . Nutritional status . Physical activity . Advanced directives . List of other physicians . Hospitalizations, surgeries, and ER visits in previous 12 months . Vitals . Screenings to include cognitive, depression, and falls . Referrals and appointments  In addition, I have reviewed and discussed with patient certain preventive protocols, quality metrics, and best practice recommendations. A written personalized care plan for preventive services as well as general preventive health recommendations were provided to patient.     Marzella Schlein, LPN   1/0/2725   Nurse Notes: Pt states she has been having trouble with constipation and wants advice on what she can take. She is inquiring about a vegetable laxative please advise

## 2021-06-28 ENCOUNTER — Ambulatory Visit: Admitting: Internal Medicine

## 2021-07-02 ENCOUNTER — Telehealth: Payer: Self-pay

## 2021-07-02 DIAGNOSIS — E785 Hyperlipidemia, unspecified: Secondary | ICD-10-CM

## 2021-07-02 DIAGNOSIS — I10 Essential (primary) hypertension: Secondary | ICD-10-CM

## 2021-07-02 DIAGNOSIS — J45909 Unspecified asthma, uncomplicated: Secondary | ICD-10-CM

## 2021-07-02 DIAGNOSIS — Z79899 Other long term (current) drug therapy: Secondary | ICD-10-CM

## 2021-07-02 DIAGNOSIS — R7303 Prediabetes: Secondary | ICD-10-CM

## 2021-07-02 NOTE — Telephone Encounter (Signed)
Patient would like to have a call back to discuss having labs drawn before appt. 10/19.

## 2021-07-08 ENCOUNTER — Ambulatory Visit: Payer: Medicare Other | Admitting: Sports Medicine

## 2021-07-08 ENCOUNTER — Other Ambulatory Visit: Payer: Self-pay

## 2021-07-08 ENCOUNTER — Encounter: Payer: Self-pay | Admitting: Sports Medicine

## 2021-07-08 DIAGNOSIS — J300 Vasomotor rhinitis: Secondary | ICD-10-CM | POA: Insufficient documentation

## 2021-07-08 DIAGNOSIS — M779 Enthesopathy, unspecified: Secondary | ICD-10-CM

## 2021-07-08 DIAGNOSIS — M2141 Flat foot [pes planus] (acquired), right foot: Secondary | ICD-10-CM

## 2021-07-08 DIAGNOSIS — M79672 Pain in left foot: Secondary | ICD-10-CM | POA: Diagnosis not present

## 2021-07-08 DIAGNOSIS — M2142 Flat foot [pes planus] (acquired), left foot: Secondary | ICD-10-CM | POA: Diagnosis not present

## 2021-07-08 DIAGNOSIS — J4540 Moderate persistent asthma, uncomplicated: Secondary | ICD-10-CM | POA: Insufficient documentation

## 2021-07-08 DIAGNOSIS — L209 Atopic dermatitis, unspecified: Secondary | ICD-10-CM | POA: Insufficient documentation

## 2021-07-08 MED ORDER — DICLOFENAC SODIUM 1 % EX GEL: 4.0000 g | Freq: Four times a day (QID) | CUTANEOUS | 0 refills | Status: DC

## 2021-07-08 NOTE — Progress Notes (Signed)
Subjective: Jill Burton is a 75 y.o. female patient who presents to office for evaluation of left heel pain.  Patient reports that the pain is getting better 3 weeks ago the door hit the back of her heel and she was having pain at the back and a little bit at the bottom of the heel has been soaking and using Voltaren gel which seems to help.  Patient denies any other pedal complaints at this time.  Patient Active Problem List   Diagnosis Date Noted   Atopic dermatitis 07/08/2021   Moderate persistent asthma, uncomplicated 07/08/2021   Vasomotor rhinitis 07/08/2021   Pre-diabetes 10/12/2020   Sleep difficulties 03/21/2014   Musculoskeletal pain 03/21/2014   Hip pain 09/23/2013   Weight gain 09/23/2013   Essential hypertension 04/16/2013   Alternative medicine  hx of homeopathy meds  04/12/2012   Obesity 04/12/2012   Fatigue 04/17/2011   THYROID STIMULATING HORMONE, ABNORMAL 08/25/2008   HYPERGLYCEMIA 08/25/2008   INSOMNIA-SLEEP DISORDER-UNSPEC 08/21/2007   Allergic rhinitis 08/21/2007   ASTHMA 08/21/2007    Current Outpatient Medications on File Prior to Visit  Medication Sig Dispense Refill   Albuterol Sulfate (PROAIR HFA IN) Inhale into the lungs.     Cholecalciferol (VITAMIN D3) 250 MCG (10000 UT) capsule See admin instructions.     DHEA 10 MG TABS See admin instructions.     fluconazole (DIFLUCAN) 150 MG tablet 1 tablet     fluticasone-salmeterol (ADVAIR) 250-50 MCG/ACT AEPB Inhale 1 puff into the lungs in the morning and at bedtime.     hydrochlorothiazide (MICROZIDE) 12.5 MG capsule Take 12.5 mg by mouth daily.     loratadine-pseudoephedrine (CLARITIN-D 12 HOUR) 5-120 MG tablet 1 tablet as needed     magnesium 30 MG tablet Take 30 mg by mouth 2 (two) times daily.     melatonin 3 MG TABS tablet 1 tablet at bedtime as needed     montelukast (SINGULAIR) 10 MG tablet 1 TABLET     triamcinolone cream (KENALOG) 0.1 % apply on skin twice a day     vitamin C (ASCORBIC ACID) 500  MG tablet Take 1,000 mg by mouth daily.     No current facility-administered medications on file prior to visit.    Allergies  Allergen Reactions   Penicillins    Sulfacetamide Sodium Other (See Comments)   Sulfonamide Derivatives     Objective:  General: Alert and oriented x3 in no acute distress  Dermatology: No open lesions bilateral lower extremities, no webspace macerations, no ecchymosis bilateral, all nails x 10 are well manicured.  Vascular: Dorsalis Pedis and Posterior Tibial pedal pulses 1/4, Capillary Fill Time 3 seconds, + pedal hair growth bilateral, no edema bilateral lower extremities, Temperature gradient within normal limits.  Neurology: Michaell Cowing sensation intact via light touch bilateral.  Musculoskeletal: Mild tenderness with palpation at insertion of the Achilles on left and to the inferior most insertion as it meets the plantar fascia on the left heel, there is minimal calcaneal exostosis with mild soft tissue swelling present and decreased ankle rom with knee extending  vs flexed resembling gastroc equnius bilateral, The achilles tendon feels intact with no nodularity or palpable dell, Thompson sign negative, pes planus foot type.   Assessment and Plan: Problem List Items Addressed This Visit   None Visit Diagnoses     Tendonitis    -  Primary   Pain of left heel       Pes planus of both feet           -  Complete examination performed -Patient declined a set of x-rays at this time because the symptoms are getting better -Discussed treatment options for likely tendinitis secondary to trauma of the door hitting the back of the heel advised patient without x-rays I cannot assure that anything is not broken however since symptoms are getting better we can safely monitor and if not better in 1 month she should get an x-ray -Rx topical Voltaren for patient to use as directed -Dispensed heel lifts and recommended gentle stretching, icing, and soaking as needed with  Epson salt -No improvement will consider x-ray next visit -Patient to return to office as scheduled if symptoms fail to continue to improve or sooner if condition worsens.  Asencion Islam, DPM

## 2021-07-14 NOTE — Addendum Note (Signed)
Addended byBerniece Andreas K on: 07/14/2021 07:01 PM   Modules accepted: Orders

## 2021-07-14 NOTE — Telephone Encounter (Signed)
Lab orders have been placed please have her make appointment for fasting lab to be done before the appointment.

## 2021-07-15 NOTE — Telephone Encounter (Signed)
Pt informed of the message below and verbalized understanding. 

## 2021-07-19 DIAGNOSIS — E559 Vitamin D deficiency, unspecified: Secondary | ICD-10-CM

## 2021-07-23 ENCOUNTER — Telehealth: Payer: Self-pay | Admitting: *Deleted

## 2021-07-23 NOTE — Telephone Encounter (Signed)
Patient is calling to request a boot or shoe(possibly get from [facility] supply) to prevent pressure on the bottom of foot, doing well but still tender in one spot underneath.Please advise.

## 2021-07-23 NOTE — Telephone Encounter (Signed)
Returned call to patient, no answer, left vmessage of Dr Wynema Birch recommendations.

## 2021-08-04 ENCOUNTER — Ambulatory Visit: Admitting: Internal Medicine

## 2021-08-05 ENCOUNTER — Ambulatory Visit: Payer: Medicare Other | Admitting: Sports Medicine

## 2021-08-16 IMAGING — CR DG SHOULDER 2+V*R*
3 series · 3 of 3 positions shown · non-contrast
Comparison: None.

CLINICAL DATA: Pain and stiffness, right shoulder

EXAM:
RIGHT SHOULDER - 2+ VIEW

[w shoulder ap internal righ]
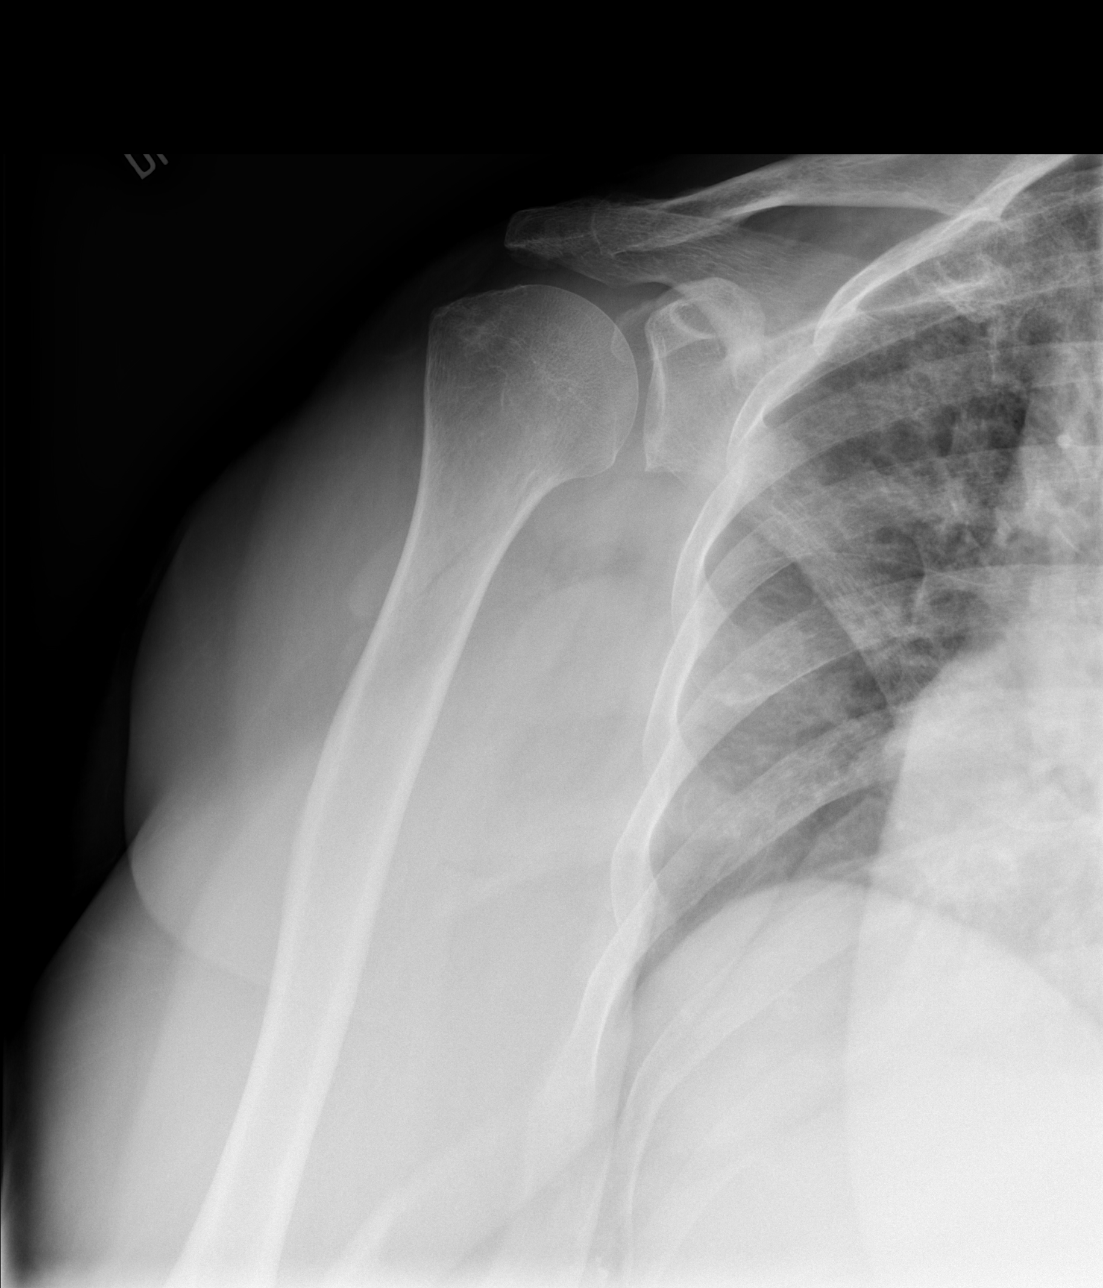

[w shoulder y view right]
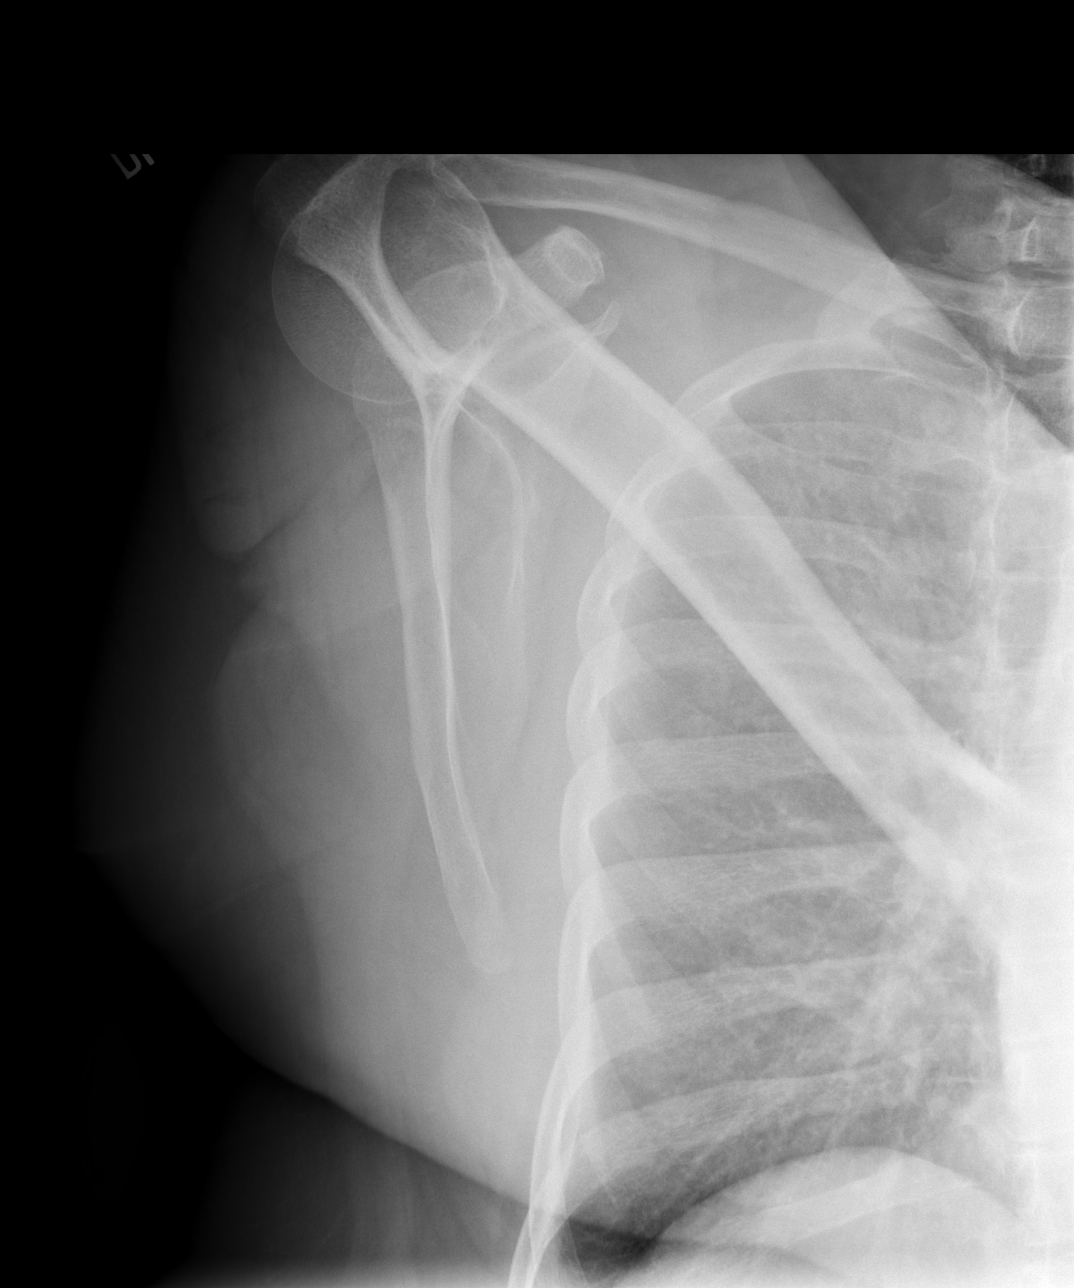

[w shoulder axillary right *]
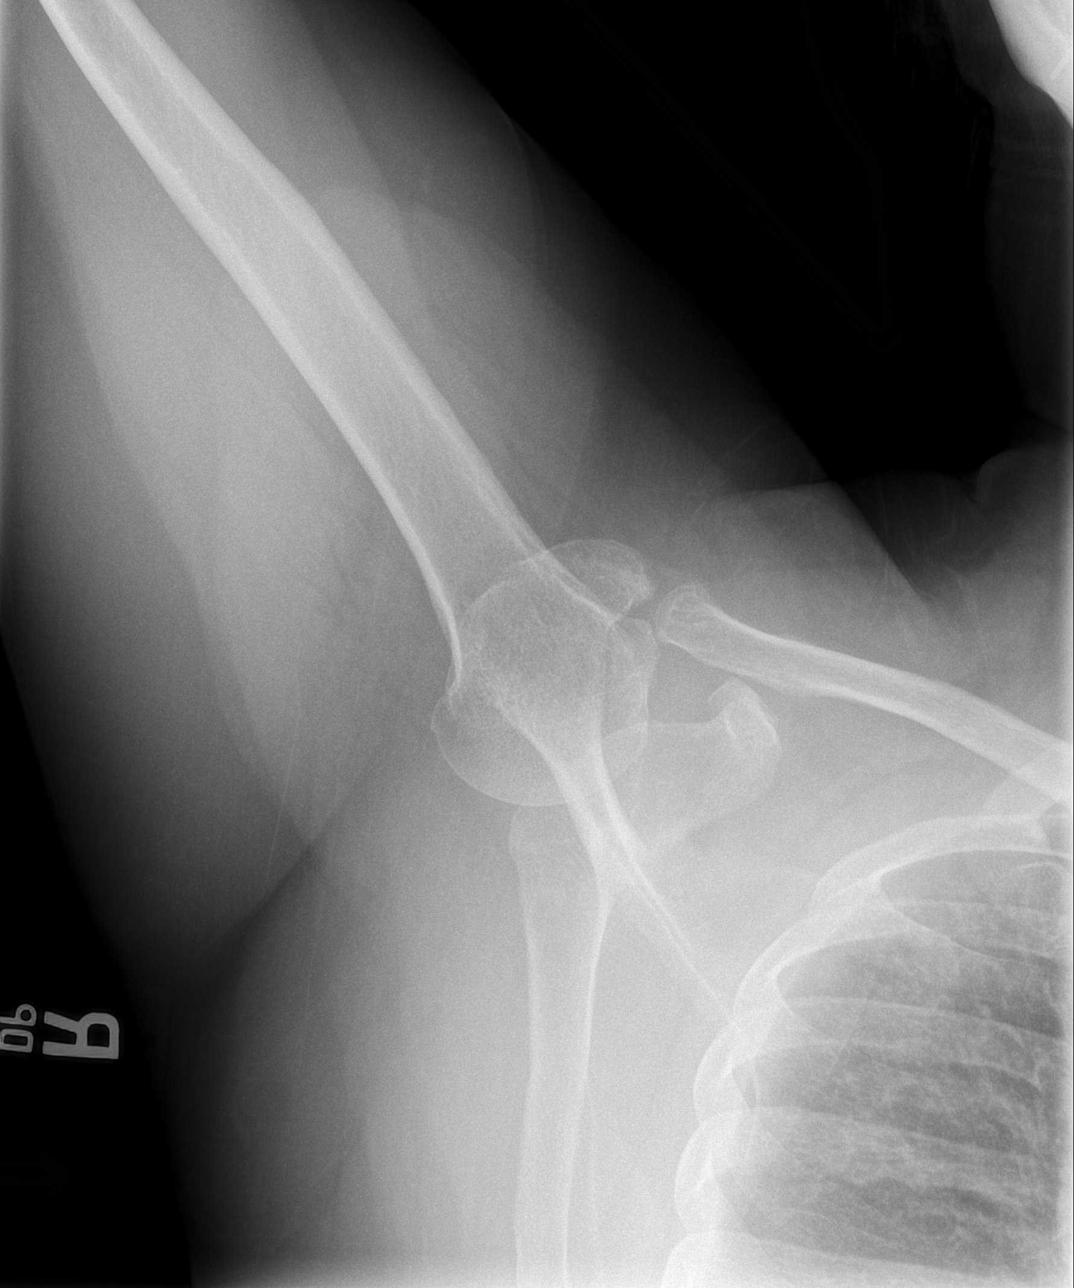

[3 of 3 positions shown; findings below may reference images not displayed]

FINDINGS: No fracture or dislocation of the right shoulder. The joint spaces
are preserved. Partially imaged right chest is unremarkable.
IMPRESSION: No fracture or dislocation of the right shoulder. The joint spaces
are preserved.

## 2021-08-19 ENCOUNTER — Telehealth: Payer: Self-pay

## 2021-08-19 ENCOUNTER — Ambulatory Visit (INDEPENDENT_AMBULATORY_CARE_PROVIDER_SITE_OTHER): Payer: Medicare Other | Admitting: Sports Medicine

## 2021-08-19 ENCOUNTER — Other Ambulatory Visit: Payer: Self-pay

## 2021-08-19 ENCOUNTER — Encounter: Payer: Self-pay | Admitting: Sports Medicine

## 2021-08-19 ENCOUNTER — Ambulatory Visit (INDEPENDENT_AMBULATORY_CARE_PROVIDER_SITE_OTHER): Payer: Medicare Other

## 2021-08-19 DIAGNOSIS — M779 Enthesopathy, unspecified: Secondary | ICD-10-CM

## 2021-08-19 DIAGNOSIS — M2142 Flat foot [pes planus] (acquired), left foot: Secondary | ICD-10-CM

## 2021-08-19 DIAGNOSIS — M79672 Pain in left foot: Secondary | ICD-10-CM

## 2021-08-19 DIAGNOSIS — M722 Plantar fascial fibromatosis: Secondary | ICD-10-CM

## 2021-08-19 DIAGNOSIS — M2141 Flat foot [pes planus] (acquired), right foot: Secondary | ICD-10-CM

## 2021-08-19 MED ORDER — DICLOFENAC SODIUM 75 MG PO TBEC: 75.0000 mg | DELAYED_RELEASE_TABLET | Freq: Two times a day (BID) | ORAL | 0 refills | Status: DC

## 2021-08-19 NOTE — Telephone Encounter (Signed)
Caller states she feels lethargic at mid-day, wants to know what kind of tests could find out what's wrong. -Caller states she feels lethargic at mid-day. She wants to get Vitamin D levels, and check Vitamin B levels.  08/18/2021 12:21:58 PM SEE PCP WITHIN 3 DAYS Yes D'Heur Ezzard Standing, RN, [employer] Complies Caller Understands Yes  08/19/21 1227: No appt noted. Pt notified that her last OV with PCP was 2020. Pt states she was told that she could get blood work completed (plans to come tomorrow) before seeing PCP. Explained to patient that she would still need to come to see Dr Fabian Sharp for her result b/c she has not been seen since 2020. Pt verb understanding. Based on pt availability & provider schedule, appt made for 09/06/21, lab appt made for 08/20/21. Pt advised to keep appt for 09/06/21.

## 2021-08-19 NOTE — Progress Notes (Signed)
Subjective: MUSKAAN SMET is a 75 y.o. female returns to office for follow up evaluation of left heel pain.  Patient reports that she still has pain on the bottom of the heel states that pain is worse with first few steps out of bed in the morning or after period of sitting and when she goes to stand states that the pain does not appear to be improving.  No other pedal complaints noted.  States that she has been using the topical gel but has not been doing anything for her pain.  Patient Active Problem List   Diagnosis Date Noted   Atopic dermatitis 07/08/2021   Moderate persistent asthma, uncomplicated 07/08/2021   Vasomotor rhinitis 07/08/2021   Pre-diabetes 10/12/2020   Sleep difficulties 03/21/2014   Musculoskeletal pain 03/21/2014   Hip pain 09/23/2013   Weight gain 09/23/2013   Essential hypertension 04/16/2013   Alternative medicine  hx of homeopathy meds  04/12/2012   Obesity 04/12/2012   Fatigue 04/17/2011   THYROID STIMULATING HORMONE, ABNORMAL 08/25/2008   HYPERGLYCEMIA 08/25/2008   INSOMNIA-SLEEP DISORDER-UNSPEC 08/21/2007   Allergic rhinitis 08/21/2007   ASTHMA 08/21/2007    Current Outpatient Medications on File Prior to Visit  Medication Sig Dispense Refill   Albuterol Sulfate (PROAIR HFA IN) Inhale into the lungs.     Cholecalciferol (VITAMIN D3) 250 MCG (10000 UT) capsule See admin instructions.     DHEA 10 MG TABS See admin instructions.     diclofenac Sodium (VOLTAREN) 1 % GEL Apply 4 g topically 4 (four) times daily. 150 g 0   fluconazole (DIFLUCAN) 150 MG tablet 1 tablet     fluticasone-salmeterol (ADVAIR) 250-50 MCG/ACT AEPB Inhale 1 puff into the lungs in the morning and at bedtime.     hydrochlorothiazide (MICROZIDE) 12.5 MG capsule Take 12.5 mg by mouth daily.     loratadine-pseudoephedrine (CLARITIN-D 12 HOUR) 5-120 MG tablet 1 tablet as needed     magnesium 30 MG tablet Take 30 mg by mouth 2 (two) times daily.     melatonin 3 MG TABS tablet 1 tablet at  bedtime as needed     montelukast (SINGULAIR) 10 MG tablet 1 TABLET     triamcinolone cream (KENALOG) 0.1 % apply on skin twice a day     vitamin C (ASCORBIC ACID) 500 MG tablet Take 1,000 mg by mouth daily.     No current facility-administered medications on file prior to visit.    Allergies  Allergen Reactions   Penicillins    Sulfacetamide Sodium Other (See Comments)   Sulfonamide Derivatives     Objective:   General:  Alert and oriented x 3, in no acute distress  Dermatology: No open lesions bilateral lower extremities, no webspace macerations, no ecchymosis bilateral, all nails x 10 are well manicured.   Vascular: Dorsalis Pedis and Posterior Tibial pedal pulses 1/4, Capillary Fill Time 3 seconds, + pedal hair growth bilateral, no edema bilateral lower extremities, Temperature gradient within normal limits.   Neurology: Michaell Cowing sensation intact via light touch bilateral.   Musculoskeletal: There is significant tenderness to palpation to the plantar fascia on the left heel,  pes planus foot type.  Assessment and Plan: Problem List Items Addressed This Visit   None Visit Diagnoses     Plantar fasciitis of left foot    -  Primary   Relevant Orders   DG Foot Complete Left   Pain of left heel       Pes planus of both feet           -  Complete examination performed.  -X-rays reviewed consistent with posterior and inferior heel spur, midtarsal breech supportive of pes planus deformity -Discussed with patient in detail the condition of plantar fasciitis -Patient declines steroid injection at this time -Rx diclofenac 75 mg twice daily -Encourage patient to consider injection however she still declines at this time -Advised patient to try cam boot or plantar fascial brace -Continue with stretching as directed -Continue with topical Voltaren gel as needed -Advised patient if her symptoms fail to improve she should highly consider having an injection -Patient to return to  office if pain fails to improve after 2 weeks may call office for follow-up appointment or sooner if problems or questions arise.  Asencion Islam, DPM

## 2021-08-20 ENCOUNTER — Other Ambulatory Visit (INDEPENDENT_AMBULATORY_CARE_PROVIDER_SITE_OTHER): Payer: Medicare Other

## 2021-08-20 DIAGNOSIS — I10 Essential (primary) hypertension: Secondary | ICD-10-CM | POA: Diagnosis not present

## 2021-08-20 DIAGNOSIS — Z79899 Other long term (current) drug therapy: Secondary | ICD-10-CM

## 2021-08-20 DIAGNOSIS — E785 Hyperlipidemia, unspecified: Secondary | ICD-10-CM

## 2021-08-20 DIAGNOSIS — E559 Vitamin D deficiency, unspecified: Secondary | ICD-10-CM | POA: Diagnosis not present

## 2021-08-20 DIAGNOSIS — R7303 Prediabetes: Secondary | ICD-10-CM

## 2021-08-20 DIAGNOSIS — J45909 Unspecified asthma, uncomplicated: Secondary | ICD-10-CM | POA: Diagnosis not present

## 2021-08-20 LAB — BASIC METABOLIC PANEL
BUN: 14 mg/dL (ref 6–23)
CO2: 30 mEq/L (ref 19–32)
Calcium: 9.3 mg/dL (ref 8.4–10.5)
Chloride: 106 mEq/L (ref 96–112)
Creatinine, Ser: 0.85 mg/dL (ref 0.40–1.20)
GFR: 67.58 mL/min (ref >60.00)
Glucose, Bld: 107 mg/dL — ABNORMAL HIGH (ref 70–99)
Potassium: 4.6 mEq/L (ref 3.5–5.1)
Sodium: 142 mEq/L (ref 135–145)

## 2021-08-20 LAB — CBC WITH DIFFERENTIAL/PLATELET
Basophils Absolute: 0 10*3/uL (ref 0.0–0.1)
Basophils Relative: 0.5 % (ref 0.0–3.0)
Eosinophils Absolute: 0.2 10*3/uL (ref 0.0–0.7)
Eosinophils Relative: 3.4 % (ref 0.0–5.0)
HCT: 42.7 % (ref 36.0–46.0)
Hemoglobin: 13.8 g/dL (ref 12.0–15.0)
Lymphocytes Relative: 25 % (ref 12.0–46.0)
Lymphs Abs: 1.8 10*3/uL (ref 0.7–4.0)
MCHC: 32.4 g/dL (ref 30.0–36.0)
MCV: 88 fl (ref 78.0–100.0)
Monocytes Absolute: 0.5 10*3/uL (ref 0.1–1.0)
Monocytes Relative: 7.7 % (ref 3.0–12.0)
Neutro Abs: 4.5 10*3/uL (ref 1.4–7.7)
Neutrophils Relative %: 63.4 % (ref 43.0–77.0)
Platelets: 208 10*3/uL (ref 150.0–400.0)
RBC: 4.86 Mil/uL (ref 3.87–5.11)
RDW: 13.9 % (ref 11.5–15.5)
WBC: 7.1 10*3/uL (ref 4.0–10.5)

## 2021-08-20 LAB — LIPID PANEL
Cholesterol: 246 mg/dL — ABNORMAL HIGH (ref 0–200)
HDL: 61 mg/dL (ref >39.00)
LDL Cholesterol: 160 mg/dL — ABNORMAL HIGH (ref 0–99)
NonHDL: 184.62
Total CHOL/HDL Ratio: 4
Triglycerides: 125 mg/dL (ref 0.0–149.0)
VLDL: 25 mg/dL (ref 0.0–40.0)

## 2021-08-20 LAB — HEPATIC FUNCTION PANEL
ALT: 18 U/L (ref 0–35)
AST: 21 U/L (ref 0–37)
Albumin: 4.2 g/dL (ref 3.5–5.2)
Alkaline Phosphatase: 87 U/L (ref 39–117)
Bilirubin, Direct: 0 mg/dL (ref 0.0–0.3)
Total Bilirubin: 0.5 mg/dL (ref 0.2–1.2)
Total Protein: 6.9 g/dL (ref 6.0–8.3)

## 2021-08-20 LAB — VITAMIN D 25 HYDROXY (VIT D DEFICIENCY, FRACTURES): VITD: 46.37 ng/mL (ref 30.00–100.00)

## 2021-08-20 LAB — HEMOGLOBIN A1C: Hgb A1c MFr Bld: 6.2 % (ref 4.6–6.5)

## 2021-08-20 LAB — TSH: TSH: 1.44 u[IU]/mL (ref 0.35–5.50)

## 2021-08-22 NOTE — Progress Notes (Signed)
Will discuss  at updoming  visit  cholesterol up some rest  normal

## 2021-08-27 ENCOUNTER — Other Ambulatory Visit: Payer: Self-pay | Admitting: Sports Medicine

## 2021-08-27 DIAGNOSIS — M2142 Flat foot [pes planus] (acquired), left foot: Secondary | ICD-10-CM

## 2021-09-05 NOTE — Progress Notes (Signed)
Chief Complaint  Patient presents with   Annual Exam     HPI: Patient  Jill Burton  75 y.o. comes in today for Preventive Health Care visit  Feels pretty good feels her blood pressure is good at home.  Feels that it is been normal and does not know why it is up in the office. Sees asthma specialist twice a year stable. At some point she had some type of chest symptoms and question low oxygen level but not having a problem now. Has declined COVID vaccines and vaccination but staying safe so does not go out in crowds much. Has had low iron in the past but some but not taking it wonders if she should. Health Maintenance  Topic Date Due   COVID-19 Vaccine (1) Never done   Pneumonia Vaccine 40+ Years old (1 - PCV) Never done   Hepatitis C Screening  Never done   Zoster Vaccines- Shingrix (1 of 2) Never done   DEXA SCAN  Never done   INFLUENZA VACCINE  01/14/2022 (Originally 05/17/2021)   MAMMOGRAM  02/16/2022 (Originally 05/27/1997)   COLONOSCOPY (Pts 45-103yrs Insurance coverage will need to be confirmed)  02/16/2022 (Originally 05/27/1992)   TETANUS/TDAP  02/16/2022 (Originally 05/27/1966)   HPV VACCINES  Aged Out   Health Maintenance Review LIFESTYLE:  Exercise:  house work   husband says always moving  Tobacco/ETS: n  Alcohol: n Sugar beverages: better sweet tea  Sleep:melatonin   pretty good  5- 10  Drug use: no HH of  2  Asthma   sees.  specialist  21 x per year.  Using crema comes and goes.     ROS:  REST of 12 system review negative except as per HPI   Past Medical History:  Diagnosis Date   Allergic rhinitis    allergy testing neg in 2011   Anemia    Asthma    Hyperlipidemia    no meds   Hypertension    does not take bp meds on a regular basis   SVD (spontaneous vaginal delivery)    x 1    Past Surgical History:  Procedure Laterality Date   CESAREAN SECTION     x3   DILITATION & CURRETTAGE/HYSTROSCOPY WITH VERSAPOINT RESECTION N/A 11/21/2013    Procedure: DILATATION & CURETTAGE/HYSTEROSCOPY WITH VERSAPOINT RESECTION;  Surgeon: Serita Kyle, MD;  Location: [facility];  Service: Gynecology;  Laterality: N/A;   HERNIA REPAIR     as child   TUBAL LIGATION      Family History  Problem Relation Age of Onset   Asthma Other     Social History   Socioeconomic History   Marital status: Married    Spouse name: Not on file   Number of children: Not on file   Years of education: Not on file   Highest education level: Not on file  Occupational History   Not on file  Tobacco Use   Smoking status: Never   Smokeless tobacco: Never  Substance and Sexual Activity   Alcohol use: No   Drug use: No   Sexual activity: Yes    Birth control/protection: Surgical  Other Topics Concern   Not on file  Social History Narrative   Married   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      hh of 2    Social Determinants of Health   Financial Resource Strain: Low Risk  Difficulty of Paying Living Expenses: Not hard at all  Food Insecurity: No Food Insecurity   Worried About Running Out of Food in the Last Year: Never true   Ran Out of Food in the Last Year: Never true  Transportation Needs: No Transportation Needs   Lack of Transportation (Medical): No   Lack of Transportation (Non-Medical): No  Physical Activity: Inactive   Days of Exercise per Week: 0 days   Minutes of Exercise per Session: 0 min  Stress: No Stress Concern Present   Feeling of Stress : Not at all  Social Connections: Moderately Integrated   Frequency of Communication with Friends and Family: Once a week   Frequency of Social Gatherings with Friends and Family: More than three times a week   Attends Religious Services: More than 4 times per year   Active Member of [employer] or Organizations: No   Attends Banker Meetings: Never   Marital Status: Married    Outpatient Medications Prior to Visit  Medication Sig  Dispense Refill   Albuterol Sulfate (PROAIR HFA IN) Inhale into the lungs.     Cholecalciferol (VITAMIN D3) 250 MCG (10000 UT) capsule See admin instructions.     fluconazole (DIFLUCAN) 150 MG tablet 1 tablet     fluticasone-salmeterol (ADVAIR) 250-50 MCG/ACT AEPB Inhale 1 puff into the lungs in the morning and at bedtime.     hydrochlorothiazide (MICROZIDE) 12.5 MG capsule Take 12.5 mg by mouth daily.     triamcinolone cream (KENALOG) 0.1 % apply on skin twice a day     vitamin C (ASCORBIC ACID) 500 MG tablet Take 1,000 mg by mouth daily.     DHEA 10 MG TABS See admin instructions.     diclofenac (VOLTAREN) 75 MG EC tablet Take 1 tablet (75 mg total) by mouth 2 (two) times daily. 30 tablet 0   diclofenac Sodium (VOLTAREN) 1 % GEL Apply 4 g topically 4 (four) times daily. 150 g 0   loratadine-pseudoephedrine (CLARITIN-D 12 HOUR) 5-120 MG tablet 1 tablet as needed     magnesium 30 MG tablet Take 30 mg by mouth 2 (two) times daily.     melatonin 3 MG TABS tablet 1 tablet at bedtime as needed     montelukast (SINGULAIR) 10 MG tablet 1 TABLET     No facility-administered medications prior to visit.     EXAM:  BP 140/80 (BP Location: Right Arm, Cuff Size: Large)   Pulse 73   Temp 98.4 F (36.9 C) (Oral)   Ht 5\' 2"  (1.575 m)   Wt 222 lb 6.4 oz (100.9 kg)   SpO2 94%   BMI 40.68 kg/m  Repeat blood pressure right arm 140/80 Body mass index is 40.68 kg/m. Wt Readings from Last 3 Encounters:  09/06/21 222 lb 6.4 oz (100.9 kg)  02/16/21 224 lb 8 oz (101.8 kg)  12/03/20 230 lb (104.3 kg)    Physical Exam: Vital signs reviewed [id number] is a well-developed well-nourished alert cooperative    who appearsr stated age in no acute distress.  HEENT: normocephalic atraumatic , Eyes: PERRL EOM's full, conjunctiva clear, Nares: paten,t no deformity discharge or tenderness., Ears: no deformity EAC's clear TMs with normal landmarks. Mouth: c masked. NECK: supple without masses, thyromegaly or  bruits. CHEST/PULM:  Clear to auscultation and percussion breath sounds equal no wheeze , rales or rhonchi. No chest wall deformities or tenderness. Breast: normal by inspection . No dimpling, discharge, masses, tenderness or discharge . CV: PMI is nondisplaced,  S1 S2 no gallops, murmurs, rubs. Peripheral pulses are full without delay.No JVD .  ABDOMEN: Bowel sounds normal nontender  No guard or rebound, no hepato splenomegal no CVA tenderness.   Extremtities:  No clubbing cyanosis or edema, no acute joint swelling or redness no focal atrophy NEURO:  Oriented x3, cranial nerves 3-12 appear to be intact, no obvious focal weakness,gait within normal limits no abnormal reflexes or asymmetrical SKIN: No acute rashes normal turgor, color, no bruising or petechiae. PSYCH: Oriented, good eye contact, no obvious depression anxiety, cognition and judgment appear normal. LN: no cervical axillary  adenopathy  Lab Results  Component Value Date   WBC 7.1 08/20/2021   HGB 13.8 08/20/2021   HCT 42.7 08/20/2021   PLT 208.0 08/20/2021   GLUCOSE 107 (H) 08/20/2021   CHOL 246 (H) 08/20/2021   TRIG 125.0 08/20/2021   HDL 61.00 08/20/2021   LDLDIRECT 157.4 09/23/2013   LDLCALC 160 (H) 08/20/2021   ALT 18 08/20/2021   AST 21 08/20/2021   NA 142 08/20/2021   K 4.6 08/20/2021   CL 106 08/20/2021   CREATININE 0.85 08/20/2021   BUN 14 08/20/2021   CO2 30 08/20/2021   TSH 1.44 08/20/2021   HGBA1C 6.2 08/20/2021    BP Readings from Last 3 Encounters:  09/06/21 140/80  02/16/21 124/76  12/03/20 120/76    Lab results reviewed with patient   ASSESSMENT AND PLAN:  Discussed the following assessment and plan:    ICD-10-CM   1. Visit for preventive health examination  Z00.00     2. Medication management  Z79.899     3. Vitamin D deficiency  E55.9     4. Hyperlipidemia, unspecified hyperlipidemia type  E78.5 Lipid panel    Hemoglobin A1c    5. Asthma, chronic, unspecified asthma severity,  uncomplicated  J45.909     6. Fasting hyperglycemia  R73.01 Lipid panel    Hemoglobin A1c    7. Elevated blood pressure reading  R03.0     8. Mammogram declined  Z53.20     9. Colon cancer screening declined  Z53.20     10. COVID-19 vaccination declined  Z28.21     Declines mammogram and colon cancer screening.  Declines COVID-vaccine. Discussed lipid profile options.  She has undertaken some lifestyle program to help with weight. Plan 5-month A1c and lipid panel. Consider coronary calcium scoring for risk assessment described today or at any time that would advise she add medication for lipid-lowering.  Not anemic at this time so do not recommend adding the iron. Do advise continuing vitamin D supplementation. Return for fasting lab in about 6 months or as needed .  Patient Care Team: Ethal Gotay, Neta Mends, MD as PCP - General Eileen Stanford, MD as Referring Physician (Allergy and Immunology) Patient Instructions  Good to see you today   Repeat BP was 140/80  . Bp goal is 130/80 range and below  Cholesterol is higher than healthy  Continue lifestyle intervention healthy eating and exercise . Weight loss and recheck fasting lipid panel and hg a1c in about 6 months  If not coming down consider  coronary artery calcium scan( 99$ self pay) to assess future risk  and consider  adding cholesterol lowering medicine to decrease risk of heart attack and stroke in future.   What happens before the procedure? Ask your health care provider for any specific instructions on how to prepare for this procedure. You may be asked to avoid products that contain caffeine, tobacco,  or nicotine for 4 hours before the procedure. What happens during the procedure?  You will undress and remove any jewelry from your neck or chest. You will put on a hospital gown. Sticky electrodes will be placed on your chest. The electrodes will be connected to an electrocardiogram (ECG) machine to record a tracing of the  electrical activity of your heart. You will lie down on a curved bed that is attached to the CT scanner. You may be given medicine to slow down your heart rate so that clear pictures can be created. You will be moved into the CT scanner, and the CT scanner will take pictures of your heart. During this time, you will be asked to lie still and hold your breath for 2-3 seconds at a time while each picture of your heart is being taken. The procedure may vary among health care providers and hospitals. What happens after the procedure? You can get dressed. You can return to your normal activities. It is up to you to get the results of your procedure. Ask your health care provider, or the department that is doing the procedure, when your results will be ready. Summary A coronary calcium scan is an imaging test used to look for deposits of plaque in the inner lining of the blood vessels of the heart (coronary arteries). Plaque is made up of calcium, protein, and fatty substances. Generally, this is a safe procedure. Tell your health care provider if you are pregnant or may be pregnant. Ask your health care provider for any specific instructions on how to prepare for this procedure. A CT scanner will take pictures of your heart. You can return to your normal activities after the scan is done. This information is not intended to replace advice given to you by your health care provider. Make sure you discuss any questions you have with your health care provider. Document Revised: 04/18/2019 Document Reviewed: 04/23/2019  Health Maintenance, Female Adopting a healthy lifestyle and getting preventive care are important in promoting health and wellness. Ask your health care provider about: The right schedule for you to have regular tests and exams. Things you can do on your own to prevent diseases and keep yourself healthy. What should I know about diet, weight, and exercise? Eat a healthy diet  Eat a  diet that includes plenty of vegetables, fruits, low-fat dairy products, and lean protein. Do not eat a lot of foods that are high in solid fats, added sugars, or sodium. Maintain a healthy weight Body mass index (BMI) is used to identify weight problems. It estimates body fat based on height and weight. Your health care provider can help determine your BMI and help you achieve or maintain a healthy weight. Get regular exercise Get regular exercise. This is one of the most important things you can do for your health. Most adults should: Exercise for at least 150 minutes each week. The exercise should increase your heart rate and make you sweat (moderate-intensity exercise). Do strengthening exercises at least twice a week. This is in addition to the moderate-intensity exercise. Spend less time sitting. Even light physical activity can be beneficial. Watch cholesterol and blood lipids Have your blood tested for lipids and cholesterol at 74 years of age, then have this test every 5 years. Have your cholesterol levels checked more often if: Your lipid or cholesterol levels are high. You are older than 74 years of age. You are at high risk for heart disease. What should I know about  cancer screening? Depending on your health history and family history, you may need to have cancer screening at various ages. This may include screening for: Breast cancer. Cervical cancer. Colorectal cancer. Skin cancer. Lung cancer. What should I know about heart disease, diabetes, and high blood pressure? Blood pressure and heart disease High blood pressure causes heart disease and increases the risk of stroke. This is more likely to develop in people who have high blood pressure readings or are overweight. Have your blood pressure checked: Every 3-5 years if you are 68-38 years of age. Every year if you are 89 years old or older. Diabetes Have regular diabetes screenings. This checks your fasting blood sugar  level. Have the screening done: Once every three years after age 53 if you are at a normal weight and have a low risk for diabetes. More often and at a younger age if you are overweight or have a high risk for diabetes. What should I know about preventing infection? Hepatitis B If you have a higher risk for hepatitis B, you should be screened for this virus. Talk with your health care provider to find out if you are at risk for hepatitis B infection. Hepatitis C Testing is recommended for: Everyone born from 1 through 1964. Anyone with known risk factors for hepatitis C. Sexually transmitted infections (STIs) Get screened for STIs, including gonorrhea and chlamydia, if: You are sexually active and are younger than 74 years of age. You are older than 74 years of age and your health care provider tells you that you are at risk for this type of infection. Your sexual activity has changed since you were last screened, and you are at increased risk for chlamydia or gonorrhea. Ask your health care provider if you are at risk. Ask your health care provider about whether you are at high risk for HIV. Your health care provider may recommend a prescription medicine to help prevent HIV infection. If you choose to take medicine to prevent HIV, you should first get tested for HIV. You should then be tested every 3 months for as long as you are taking the medicine. Pregnancy If you are about to stop having your period (premenopausal) and you may become pregnant, seek counseling before you get pregnant. Take 400 to 800 micrograms (mcg) of folic acid every day if you become pregnant. Ask for birth control (contraception) if you want to prevent pregnancy. Osteoporosis and menopause Osteoporosis is a disease in which the bones lose minerals and strength with aging. This can result in bone fractures. If you are 61 years old or older, or if you are at risk for osteoporosis and fractures, ask your health care  provider if you should: Be screened for bone loss. Take a calcium or vitamin D supplement to lower your risk of fractures. Be given hormone replacement therapy (HRT) to treat symptoms of menopause. Follow these instructions at home: Alcohol use Do not drink alcohol if: Your health care provider tells you not to drink. You are pregnant, may be pregnant, or are planning to become pregnant. If you drink alcohol: Limit how much you have to: 0-1 drink a day. Know how much alcohol is in your drink. In the [place] one drink equals one 12 oz bottle of beer (355 mL), one 5 oz glass of wine (148 mL), or one 1 oz glass of hard liquor (44 mL). Lifestyle Do not use any products that contain nicotine or tobacco. These products include cigarettes, chewing tobacco, and vaping devices,  such as e-cigarettes. If you need help quitting, ask your health care provider. Do not use street drugs. Do not share needles. Ask your health care provider for help if you need support or information about quitting drugs. General instructions Schedule regular health, dental, and eye exams. Stay current with your vaccines. Tell your health care provider if: You often feel depressed. You have ever been abused or do not feel safe at home. Summary Adopting a healthy lifestyle and getting preventive care are important in promoting health and wellness. Follow your health care provider's instructions about healthy diet, exercising, and getting tested or screened for diseases. Follow your health care provider's instructions on monitoring your cholesterol and blood pressure. This information is not intended to replace advice given to you by your health care provider. Make sure you discuss any questions you have with your health care provider. Document Revised: 02/22/2021 Document Reviewed: 02/22/2021 Elsevier Patient Education  2022 [employer].  Elsevier Patient Education  2022 [employer].  [place]. Madalena Kesecker M.D.

## 2021-09-06 ENCOUNTER — Encounter: Payer: Self-pay | Admitting: Internal Medicine

## 2021-09-06 ENCOUNTER — Ambulatory Visit (INDEPENDENT_AMBULATORY_CARE_PROVIDER_SITE_OTHER): Payer: Medicare Other | Admitting: Internal Medicine

## 2021-09-06 VITALS — BP 140/80 | HR 73 | Temp 98.4°F | Ht 62.0 in | Wt 222.4 lb

## 2021-09-06 DIAGNOSIS — Z2821 Immunization not carried out because of patient refusal: Secondary | ICD-10-CM

## 2021-09-06 DIAGNOSIS — E785 Hyperlipidemia, unspecified: Secondary | ICD-10-CM

## 2021-09-06 DIAGNOSIS — I10 Essential (primary) hypertension: Secondary | ICD-10-CM

## 2021-09-06 DIAGNOSIS — Z5320 Procedure and treatment not carried out because of patient's decision for unspecified reasons: Secondary | ICD-10-CM

## 2021-09-06 DIAGNOSIS — Z0000 Encounter for general adult medical examination without abnormal findings: Secondary | ICD-10-CM | POA: Diagnosis not present

## 2021-09-06 DIAGNOSIS — Z79899 Other long term (current) drug therapy: Secondary | ICD-10-CM

## 2021-09-06 DIAGNOSIS — R030 Elevated blood-pressure reading, without diagnosis of hypertension: Secondary | ICD-10-CM

## 2021-09-06 DIAGNOSIS — E559 Vitamin D deficiency, unspecified: Secondary | ICD-10-CM

## 2021-09-06 DIAGNOSIS — J45909 Unspecified asthma, uncomplicated: Secondary | ICD-10-CM | POA: Diagnosis not present

## 2021-09-06 DIAGNOSIS — R7301 Impaired fasting glucose: Secondary | ICD-10-CM

## 2021-09-06 NOTE — Patient Instructions (Addendum)
Good to see you today   Repeat BP was 140/80  . Bp goal is 130/80 range and below  Cholesterol is higher than healthy  Continue lifestyle intervention healthy eating and exercise . Weight loss and recheck fasting lipid panel and hg a1c in about 6 months  If not coming down consider  coronary artery calcium scan( 99$ self pay) to assess future risk  and consider  adding cholesterol lowering medicine to decrease risk of heart attack and stroke in future.   What happens before the procedure? Ask your health care provider for any specific instructions on how to prepare for this procedure. You may be asked to avoid products that contain caffeine, tobacco, or nicotine for 4 hours before the procedure. What happens during the procedure?  You will undress and remove any jewelry from your neck or chest. You will put on a hospital gown. Sticky electrodes will be placed on your chest. The electrodes will be connected to an electrocardiogram (ECG) machine to record a tracing of the electrical activity of your heart. You will lie down on a curved bed that is attached to the CT scanner. You may be given medicine to slow down your heart rate so that clear pictures can be created. You will be moved into the CT scanner, and the CT scanner will take pictures of your heart. During this time, you will be asked to lie still and hold your breath for 2-3 seconds at a time while each picture of your heart is being taken. The procedure may vary among health care providers and hospitals. What happens after the procedure? You can get dressed. You can return to your normal activities. It is up to you to get the results of your procedure. Ask your health care provider, or the department that is doing the procedure, when your results will be ready. Summary A coronary calcium scan is an imaging test used to look for deposits of plaque in the inner lining of the blood vessels of the heart (coronary arteries). Plaque is made  up of calcium, protein, and fatty substances. Generally, this is a safe procedure. Tell your health care provider if you are pregnant or may be pregnant. Ask your health care provider for any specific instructions on how to prepare for this procedure. A CT scanner will take pictures of your heart. You can return to your normal activities after the scan is done. This information is not intended to replace advice given to you by your health care provider. Make sure you discuss any questions you have with your health care provider. Document Revised: 04/18/2019 Document Reviewed: 04/23/2019  Health Maintenance, Female Adopting a healthy lifestyle and getting preventive care are important in promoting health and wellness. Ask your health care provider about: The right schedule for you to have regular tests and exams. Things you can do on your own to prevent diseases and keep yourself healthy. What should I know about diet, weight, and exercise? Eat a healthy diet  Eat a diet that includes plenty of vegetables, fruits, low-fat dairy products, and lean protein. Do not eat a lot of foods that are high in solid fats, added sugars, or sodium. Maintain a healthy weight Body mass index (BMI) is used to identify weight problems. It estimates body fat based on height and weight. Your health care provider can help determine your BMI and help you achieve or maintain a healthy weight. Get regular exercise Get regular exercise. This is one of the most important things  you can do for your health. Most adults should: Exercise for at least 150 minutes each week. The exercise should increase your heart rate and make you sweat (moderate-intensity exercise). Do strengthening exercises at least twice a week. This is in addition to the moderate-intensity exercise. Spend less time sitting. Even light physical activity can be beneficial. Watch cholesterol and blood lipids Have your blood tested for lipids and  cholesterol at 74 years of age, then have this test every 5 years. Have your cholesterol levels checked more often if: Your lipid or cholesterol levels are high. You are older than 74 years of age. You are at high risk for heart disease. What should I know about cancer screening? Depending on your health history and family history, you may need to have cancer screening at various ages. This may include screening for: Breast cancer. Cervical cancer. Colorectal cancer. Skin cancer. Lung cancer. What should I know about heart disease, diabetes, and high blood pressure? Blood pressure and heart disease High blood pressure causes heart disease and increases the risk of stroke. This is more likely to develop in people who have high blood pressure readings or are overweight. Have your blood pressure checked: Every 3-5 years if you are 41-14 years of age. Every year if you are 31 years old or older. Diabetes Have regular diabetes screenings. This checks your fasting blood sugar level. Have the screening done: Once every three years after age 54 if you are at a normal weight and have a low risk for diabetes. More often and at a younger age if you are overweight or have a high risk for diabetes. What should I know about preventing infection? Hepatitis B If you have a higher risk for hepatitis B, you should be screened for this virus. Talk with your health care provider to find out if you are at risk for hepatitis B infection. Hepatitis C Testing is recommended for: Everyone born from [birth year] through 1964. Anyone with known risk factors for hepatitis C. Sexually transmitted infections (STIs) Get screened for STIs, including gonorrhea and chlamydia, if: You are sexually active and are younger than 74 years of age. You are older than 74 years of age and your health care provider tells you that you are at risk for this type of infection. Your sexual activity has changed since you were last screened,  and you are at increased risk for chlamydia or gonorrhea. Ask your health care provider if you are at risk. Ask your health care provider about whether you are at high risk for HIV. Your health care provider may recommend a prescription medicine to help prevent HIV infection. If you choose to take medicine to prevent HIV, you should first get tested for HIV. You should then be tested every 3 months for as long as you are taking the medicine. Pregnancy If you are about to stop having your period (premenopausal) and you may become pregnant, seek counseling before you get pregnant. Take 400 to 800 micrograms (mcg) of folic acid every day if you become pregnant. Ask for birth control (contraception) if you want to prevent pregnancy. Osteoporosis and menopause Osteoporosis is a disease in which the bones lose minerals and strength with aging. This can result in bone fractures. If you are 21 years old or older, or if you are at risk for osteoporosis and fractures, ask your health care provider if you should: Be screened for bone loss. Take a calcium or vitamin D supplement to lower your risk of fractures.  Be given hormone replacement therapy (HRT) to treat symptoms of menopause. Follow these instructions at home: Alcohol use Do not drink alcohol if: Your health care provider tells you not to drink. You are pregnant, may be pregnant, or are planning to become pregnant. If you drink alcohol: Limit how much you have to: 0-1 drink a day. Know how much alcohol is in your drink. In the [place] one drink equals one 12 oz bottle of beer (355 mL), one 5 oz glass of wine (148 mL), or one 1 oz glass of hard liquor (44 mL). Lifestyle Do not use any products that contain nicotine or tobacco. These products include cigarettes, chewing tobacco, and vaping devices, such as e-cigarettes. If you need help quitting, ask your health care provider. Do not use street drugs. Do not share needles. Ask your health care  provider for help if you need support or information about quitting drugs. General instructions Schedule regular health, dental, and eye exams. Stay current with your vaccines. Tell your health care provider if: You often feel depressed. You have ever been abused or do not feel safe at home. Summary Adopting a healthy lifestyle and getting preventive care are important in promoting health and wellness. Follow your health care provider's instructions about healthy diet, exercising, and getting tested or screened for diseases. Follow your health care provider's instructions on monitoring your cholesterol and blood pressure. This information is not intended to replace advice given to you by your health care provider. Make sure you discuss any questions you have with your health care provider. Document Revised: 02/22/2021 Document Reviewed: 02/22/2021 Elsevier Patient Education  2022 [employer].  Elsevier Patient Education  2022 [employer].

## 2021-09-16 ENCOUNTER — Encounter: Payer: Self-pay | Admitting: Internal Medicine

## 2021-09-16 MED ORDER — METFORMIN HCL ER 500 MG PO TB24: 500.0000 mg | ORAL_TABLET | Freq: Every day | ORAL | 1 refills | Status: DC

## 2021-09-16 NOTE — Telephone Encounter (Signed)
I sent in the metformin  to  take once a day with a meal  even though says breakfast  can take with a different meal if it helps tolerate. You can also try taking 3 days a week and work up to  daily.

## 2021-09-20 ENCOUNTER — Telehealth: Payer: Self-pay | Admitting: Internal Medicine

## 2021-09-20 NOTE — Telephone Encounter (Signed)
Handicap Placard form to be filled out--placed in dr's folder.   *received by mail

## 2021-10-03 ENCOUNTER — Encounter: Payer: Self-pay | Admitting: Internal Medicine

## 2021-10-03 NOTE — Telephone Encounter (Signed)
Form completed on your desk 

## 2021-10-06 NOTE — Telephone Encounter (Signed)
Yes those are other options and can help with weight loss.   Sometimes insurance will pay for 1 medicine over another and we cannot always tell until we prescribe it.  consider Ozempic weekly shot Please make a virtual or telephone visit so we can discuss options

## 2021-10-14 ENCOUNTER — Telehealth (INDEPENDENT_AMBULATORY_CARE_PROVIDER_SITE_OTHER): Payer: Medicare Other | Admitting: Internal Medicine

## 2021-10-14 ENCOUNTER — Encounter: Payer: Self-pay | Admitting: Internal Medicine

## 2021-10-14 DIAGNOSIS — Z79899 Other long term (current) drug therapy: Secondary | ICD-10-CM | POA: Diagnosis not present

## 2021-10-14 DIAGNOSIS — Z5189 Encounter for other specified aftercare: Secondary | ICD-10-CM

## 2021-10-14 DIAGNOSIS — I10 Essential (primary) hypertension: Secondary | ICD-10-CM | POA: Diagnosis not present

## 2021-10-14 DIAGNOSIS — R030 Elevated blood-pressure reading, without diagnosis of hypertension: Secondary | ICD-10-CM | POA: Diagnosis not present

## 2021-10-14 DIAGNOSIS — R7303 Prediabetes: Secondary | ICD-10-CM | POA: Diagnosis not present

## 2021-10-14 MED ORDER — OZEMPIC (0.25 OR 0.5 MG/DOSE) 2 MG/1.5ML ~~LOC~~ SOPN: 0.2500 mg | PEN_INJECTOR | SUBCUTANEOUS | 3 refills | Status: DC

## 2021-10-14 NOTE — Progress Notes (Signed)
Virtual Visit via Video Note  I connected with.Jill Burton  on 10/14/21 at  8:30 AM EST by a video enabled telemedicine application and verified that I am speaking with the correct person using two identifiers. Location patient: home Location provider:home office Persons participating in the virtual visit: patient, provider  [place] national recommendations  regarding COVID 19 pandemic   video visit is advised over in office visit for this patient.  Patient aware  of the limitations of evaluation and management by telemedicine and  availability of in person appointments. and agreed to proceed.   HPI: Jill Burton presents for video visit to consider medicine for weight loss. She tried the metformin and stopped it not sure she feels tired on it and groggy feeling that this is a side effect Her alternative medicine doctor in a couple of other friends suggested other class of medicine such as [place] etc.  Even Victoza. Visit to assess this today her current weight is 221. She gets her asthma medications from her asthma doctor usually and is needs a refill Is not taking HCTZ very much just when she gets swelling but states that B6 helps her with that States her blood pressure "is good".    ROS: See pertinent positives and negatives per HPI.  Past Medical History:  Diagnosis Date   Allergic rhinitis    allergy testing neg in 2011   Anemia    Asthma    Hyperlipidemia    no meds   Hypertension    does not take bp meds on a regular basis   SVD (spontaneous vaginal delivery)    x 1    Past Surgical History:  Procedure Laterality Date   CESAREAN SECTION     x3   DILITATION & CURRETTAGE/HYSTROSCOPY WITH VERSAPOINT RESECTION N/A 11/21/2013   Procedure: DILATATION & CURETTAGE/HYSTEROSCOPY WITH VERSAPOINT RESECTION;  Surgeon: Serita Kyle, MD;  Location: [facility];  Service: Gynecology;  Laterality: N/A;   HERNIA REPAIR     as child   TUBAL LIGATION      Family  History  Problem Relation Age of Onset   Asthma Other     Social History   Tobacco Use   Smoking status: Never   Smokeless tobacco: Never  Substance Use Topics   Alcohol use: No   Drug use: No      Current Outpatient Medications:    Albuterol Sulfate (PROAIR HFA IN), Inhale into the lungs., Disp: , Rfl:    Cholecalciferol (VITAMIN D3) 250 MCG (10000 UT) capsule, See admin instructions., Disp: , Rfl:    hydrochlorothiazide (MICROZIDE) 12.5 MG capsule, Take 12.5 mg by mouth daily., Disp: , Rfl:    Semaglutide,0.25 or 0.5MG /DOS, (OZEMPIC, 0.25 OR 0.5 MG/DOSE,) 2 MG/1.5ML SOPN, Inject 0.25 mg into the skin once a week. For 4 weeks ,then increase dose  to 0.5 mg per week, Disp: 1.5 mL, Rfl: 3   triamcinolone cream (KENALOG) 0.1 %, apply on skin twice a day, Disp: , Rfl:    vitamin C (ASCORBIC ACID) 500 MG tablet, Take 1,000 mg by mouth daily., Disp: , Rfl:   EXAM: BP Readings from Last 3 Encounters:  09/06/21 140/80  02/16/21 124/76  12/03/20 120/76    VITALS per patient if applicable:  GENERAL: alert, oriented, appears well and in no acute distress  HEENT: atraumatic, conjunttiva clear, no obvious abnormalities on inspection of external nose and ears  NECK: normal movements of the head and neck  LUNGS: on inspection no  signs of respiratory distress, breathing rate appears normal, no obvious gross SOB, gasping or wheezing  CV: no obvious cyanosis  MS: moves all visible extremities without noticeable abnormality  PSYCH/NEURO: pleasant and cooperative, no obvious depression or anxiety, speech and thought processing grossly intact Lab Results  Component Value Date   WBC 7.1 08/20/2021   HGB 13.8 08/20/2021   HCT 42.7 08/20/2021   PLT 208.0 08/20/2021   GLUCOSE 107 (H) 08/20/2021   CHOL 246 (H) 08/20/2021   TRIG 125.0 08/20/2021   HDL 61.00 08/20/2021   LDLDIRECT 157.4 09/23/2013   LDLCALC 160 (H) 08/20/2021   ALT 18 08/20/2021   AST 21 08/20/2021   NA 142  08/20/2021   K 4.6 08/20/2021   CL 106 08/20/2021   CREATININE 0.85 08/20/2021   BUN 14 08/20/2021   CO2 30 08/20/2021   TSH 1.44 08/20/2021   HGBA1C 6.2 08/20/2021    ASSESSMENT AND PLAN:  Discussed the following assessment and plan:    ICD-10-CM   1. Morbid obesity (HCC)  E66.01     2. Pre-diabetes  R73.03     3. Medication management  Z79.899     4. Elevated blood pressure reading  R03.0    states at goal now    5. Essential hypertension  I10     6. Alternative medicine  hx of homeopathy meds   Z51.89     Discussed options she would be a candidate for GLP meds. At this point would prescribe Ozempic weekly injection with explanation has FDA indicated for diabetes prediabetes and at high dose equivalent for weight loss. Uncertain of insurance coverage for them as the majority. Discussed risk side effects potential however most people do well. Plan check with [place] in a month weight how medicine is doing ramp up from 0.25 to 0.5 mg/week visit in 3 months with weight A1c etc.  Contact us in interim if any questions or concerns. Have asthma doctor refill her asthmatic medicines and for now hold off on the HCTZ since she is not not been taking it on a regular basis.  Counseled.   Expectant management and discussion of plan and treatment with opportunity to ask questions and all were answered. The patient agreed with the plan and demonstrated an understanding of the instructions.  review counsel plan 32 minutes  Advised to call back or seek an in-person evaluation if worsening  or having  further concerns  in interim. Return for 3 mos visit    and 1 mos update weight and status of medication.    Berniece Andreas, MD

## 2021-11-08 ENCOUNTER — Telehealth: Payer: Self-pay | Admitting: Internal Medicine

## 2021-11-08 NOTE — Telephone Encounter (Signed)
Patient called because she would like a prescription for a freestyle libre to check her blood sugar levels.    Please send new prescription to   [id number] - [place], [place] - [address] AT [facility] [address] [facility] [address] Phone:  [phone]  Fax:  [phone]       Please advise

## 2021-11-09 ENCOUNTER — Other Ambulatory Visit: Payer: Self-pay

## 2021-11-09 DIAGNOSIS — R7303 Prediabetes: Secondary | ICD-10-CM

## 2021-11-09 NOTE — Telephone Encounter (Signed)
Okay to send in prescription as requested

## 2021-11-09 NOTE — Telephone Encounter (Signed)
Last Ov 09/06/21 Please advise

## 2021-11-16 ENCOUNTER — Other Ambulatory Visit: Payer: Self-pay

## 2021-11-17 MED ORDER — FREESTYLE LIBRE SENSOR SYSTEM MISC: 1.0000 | Freq: Every day | 0 refills | Status: DC

## 2021-11-18 ENCOUNTER — Telehealth: Payer: Self-pay | Admitting: Internal Medicine

## 2021-11-18 MED ORDER — OZEMPIC (0.25 OR 0.5 MG/DOSE) 2 MG/1.5ML ~~LOC~~ SOPN: 0.2500 mg | PEN_INJECTOR | SUBCUTANEOUS | 3 refills | Status: DC

## 2021-11-18 NOTE — Telephone Encounter (Signed)
Patient has finished the Semaglutide,0.25 or 0.5MG /DOS, (OZEMPIC, 0.25 OR 0.5 MG/DOSE,) 2 MG/1.5ML SOPN

## 2021-11-18 NOTE — Telephone Encounter (Signed)
Refill the ozempic  and stay on it for now andcontinue. Make fu visit  to decide on dosing and fu

## 2021-11-18 NOTE — Telephone Encounter (Signed)
Pt informed of message and f/u lab appt scheduled. Rx refilled.

## 2021-11-18 NOTE — Telephone Encounter (Signed)
Patient called because she has completed her and she wanted to know what her next step would be, does she need to have blood work and will she be getting a refill.      Good callback number is [phone]      Please advise

## 2021-11-22 ENCOUNTER — Telehealth: Payer: Self-pay

## 2021-11-22 ENCOUNTER — Telehealth: Payer: Self-pay | Admitting: Internal Medicine

## 2021-11-22 NOTE — Telephone Encounter (Signed)
PA has been started for [employer]  (Key: [id number])

## 2021-11-22 NOTE — Telephone Encounter (Signed)
Patient called to get PA for [facility]. Patient states that she spoke with insurance and they said if they had PA it would bring the cost of prescription down for her.  Please send to   [id number] - [place], [place] [address] AT [address] [place] Phone:  [phone]  Fax:  [phone]        Please advise

## 2021-11-24 NOTE — Telephone Encounter (Signed)
Can appeal but not I dont have other information.  Tell her the denial  Does the [facility] cover this? Another path?

## 2021-11-24 NOTE — Telephone Encounter (Signed)
I received denial letter from [employer] stating that the pt's Freestyle sensor has been denied due to the pt not taking insulin and not having a dx of diabetes. An appeal can be placed and resent to [place] [facility] for decision. Pt has current dx of Pre-diabetes.

## 2021-11-25 NOTE — Telephone Encounter (Signed)
Pt informed of the denial and stated she will reach out to the [place] concerning Freestyle.

## 2022-01-11 ENCOUNTER — Encounter: Payer: Self-pay | Admitting: Internal Medicine

## 2022-01-11 ENCOUNTER — Ambulatory Visit (INDEPENDENT_AMBULATORY_CARE_PROVIDER_SITE_OTHER): Payer: Medicare Other | Admitting: Internal Medicine

## 2022-01-11 VITALS — BP 130/80 | HR 60 | Temp 98.5°F | Ht 62.0 in | Wt 222.0 lb

## 2022-01-11 DIAGNOSIS — Z5189 Encounter for other specified aftercare: Secondary | ICD-10-CM

## 2022-01-11 DIAGNOSIS — Z79899 Other long term (current) drug therapy: Secondary | ICD-10-CM | POA: Diagnosis not present

## 2022-01-11 DIAGNOSIS — R7303 Prediabetes: Secondary | ICD-10-CM | POA: Diagnosis not present

## 2022-01-11 DIAGNOSIS — R109 Unspecified abdominal pain: Secondary | ICD-10-CM

## 2022-01-11 LAB — POCT URINALYSIS DIPSTICK
Bilirubin, UA: NEGATIVE
Blood, UA: NEGATIVE
Glucose, UA: NEGATIVE
Ketones, UA: NEGATIVE
Leukocytes, UA: NEGATIVE
Nitrite, UA: NEGATIVE
Protein, UA: NEGATIVE
Spec Grav, UA: 1.025 (ref 1.010–1.025)
Urobilinogen, UA: 0.2 E.U./dL
pH, UA: 6 (ref 5.0–8.0)

## 2022-01-11 LAB — POCT GLYCOSYLATED HEMOGLOBIN (HGB A1C): Hemoglobin A1C: 5.8 % — AB (ref 4.0–5.6)

## 2022-01-11 NOTE — Progress Notes (Signed)
? ?Chief Complaint  ?Patient presents with  ? Back Pain  ? ? ?HPI: ?Jill Burton 76 y.o. come in for new problem . ?Left side flank pain  when laying down  for about a week  getting better   but familu and she wanted to check this out.  ?May have had urin frequency at initial but no dysuria and  ?Drinking a lot of water is better  ?Using drop of essntial oil. ? ?No fever.  ?? Lifted suitcase.  Worse when laying on left side   better with upright and other position  no radiation ? ?ROS: See pertinent positives and negatives per HPI. No gi gu sx otherwise  no cough fever resp sx  ?Taking  ozempic may be only the 0.25  has ?  No se noted otherwise   ?Insurance denied  libre freestle ?Past Medical History:  ?Diagnosis Date  ? Allergic rhinitis   ? allergy testing neg in 2011  ? Anemia   ? Asthma   ? Hyperlipidemia   ? no meds  ? Hypertension   ? does not take bp meds on a regular basis  ? SVD (spontaneous vaginal delivery)   ? x 1  ? ? ?Family History  ?Problem Relation Age of Onset  ? Asthma Other   ? ? ?Social History  ? ?Socioeconomic History  ? Marital status: Married  ?  Spouse name: Not on file  ? Number of children: Not on file  ? Years of education: Not on file  ? Highest education level: Not on file  ?Occupational History  ? Not on file  ?Tobacco Use  ? Smoking status: Never  ? Smokeless tobacco: Never  ?Substance and Sexual Activity  ? Alcohol use: No  ? Drug use: No  ? Sexual activity: Yes  ?  Birth control/protection: Surgical  ?Other Topics Concern  ? Not on file  ?Social History Narrative  ? Married  ? 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 No sig ets  ? No insurance until gets on medicare  In august.  ?   ? hh of 2   ? ?Social Determinants of Health  ? ?Financial Resource Strain: Low Risk   ? Difficulty of Paying Living Expenses: Not hard at all  ?Food Insecurity: No Food Insecurity  ? Worried About Charity fundraiser in the Last Year: Never true  ? Ran Out of Food in the Last Year: Never true  ?Transportation Needs:  No Transportation Needs  ? Lack of Transportation (Medical): No  ? Lack of Transportation (Non-Medical): No  ?Physical Activity: Inactive  ? Days of Exercise per Week: 0 days  ? Minutes of Exercise per Session: 0 min  ?Stress: No Stress Concern Present  ? Feeling of Stress : Not at all  ?Social Connections: Moderately Integrated  ? Frequency of Communication with Friends and Family: Once a week  ? Frequency of Social Gatherings with Friends and Family: More than three times a week  ? Attends Religious Services: More than 4 times per year  ? Active Member of Clubs or Organizations: No  ? Attends Archivist Meetings: Never  ? Marital Status: Married  ? ? ?Outpatient Medications Prior to Visit  ?Medication Sig Dispense Refill  ? Albuterol Sulfate (PROAIR HFA IN) Inhale into the lungs.    ? Cholecalciferol (VITAMIN D3) 250 MCG (10000 UT) capsule See admin instructions.    ? hydrochlorothiazide (MICROZIDE) 12.5 MG capsule Take 12.5 mg by mouth daily.    ?  Semaglutide,0.25 or 0.5MG/DOS, (OZEMPIC, 0.25 OR 0.5 MG/DOSE,) 2 MG/1.5ML SOPN Inject 0.25 mg into the skin once a week. For 4 weeks ,then increase dose  to 0.5 mg per week 1.5 mL 3  ? triamcinolone cream (KENALOG) 0.1 % apply on skin twice a day    ? vitamin C (ASCORBIC ACID) 500 MG tablet Take 1,000 mg by mouth daily.    ? Continuous Blood Gluc Sensor (FREESTYLE LIBRE SENSOR SYSTEM) MISC Apply 1 kit topically daily. 1 each 0  ? ?No facility-administered medications prior to visit.  ? ? ? ?EXAM: ? ?BP 130/80 (BP Location: Left Arm, Patient Position: Sitting, Cuff Size: Normal)   Pulse 60   Temp 98.5 ?F (36.9 ?C) (Oral)   Ht 5' 2" (1.575 m)   Wt 222 lb (100.7 kg)   SpO2 100%   BMI 40.60 kg/m?  ? ?Body mass index is 40.6 kg/m?. ? ?GENERAL: vitals reviewed and listed above, alert, oriented, appears well hydrated and in no acute distress ?HEENT: atraumatic, conjunctiva  clear, no obvious abnormalities on inspection of external nose and ears NECK: no obvious  masses on inspection palpation  ?LUNGS: clear to auscultation bilaterally, no wheezes, rales or rhonchi, good air movement ?CV: HRRR, no clubbing cyanosis or  peripheral edema nl cap refill  ?Area of concern is smal above left lower back  no rash radiation ?Abdomen:  Sof,t normal bowel sounds without hepatosplenomegaly, no guarding rebound or masses no CVA tenderness  ?MS: moves all extremities without noticeable focal  abnormality ?PSYCH: pleasant and cooperative, no obvious depression or anxiety ?Lab Results  ?Component Value Date  ? WBC 7.1 08/20/2021  ? HGB 13.8 08/20/2021  ? HCT 42.7 08/20/2021  ? PLT 208.0 08/20/2021  ? GLUCOSE 107 (H) 08/20/2021  ? CHOL 246 (H) 08/20/2021  ? TRIG 125.0 08/20/2021  ? HDL 61.00 08/20/2021  ? LDLDIRECT 157.4 09/23/2013  ? LDLCALC 160 (H) 08/20/2021  ? ALT 18 08/20/2021  ? AST 21 08/20/2021  ? NA 142 08/20/2021  ? K 4.6 08/20/2021  ? CL 106 08/20/2021  ? CREATININE 0.85 08/20/2021  ? BUN 14 08/20/2021  ? CO2 30 08/20/2021  ? TSH 1.44 08/20/2021  ? HGBA1C 5.8 (A) 01/11/2022  ? ?BP Readings from Last 3 Encounters:  ?01/11/22 130/80  ?09/06/21 140/80  ?02/16/21 124/76  ? ?Urinalysis ?   ?Component Value Date/Time  ? BILIRUBINUR neg 01/11/2022 1445  ? KETONESUR negative 09/23/2013 1614  ? PROTEINUR Negative 01/11/2022 1445  ? UROBILINOGEN 0.2 01/11/2022 1445  ? NITRITE neg 01/11/2022 1445  ? LEUKOCYTESUR Negative 01/11/2022 1445  ? ?Wt Readings from Last 3 Encounters:  ?01/11/22 222 lb (100.7 kg)  ?09/06/21 222 lb 6.4 oz (100.9 kg)  ?02/16/21 224 lb 8 oz (101.8 kg)  ? ? ? ?ASSESSMENT AND PLAN: ? ?Discussed the following assessment and plan: ? ?Flank pain - ua clear no evidece of uti no hematuria poss ms getting better will follow - Plan: POC Urinalysis Dipstick ? ?Pre-diabetes - a1c is improved. - Plan: POC HgB A1c ? ?Medication management - reviewd  ozempic pen  inc to 0.5 mg per week and then call for rx  1 mg   ? ?Alternative medicine  hx of homeopathy meds - has fu soon uses  essential oils asks about flushing kidneys :avoid  supp for now x water  ? ?Morbid obesity ([place]) - no weight change but on test dose  never increased ozempic yet ? ?-Patient advised to return or notify health care team  if  new concerns arise. ? ?Patient Instructions  ?Urine is clear  No sign of infection ? ?I think this may be a muscle pull.  ? ?Hg a1c is  better   5.8!  Down to 6.2 . ?Stay on ozempic and take 0.5 mg per week    ?We can increase to 1.0 mg per week. After a month  ;let us know when you need the next dose.  ? ? ? ?Standley Brooking. Panosh M.D. ?

## 2022-01-11 NOTE — Patient Instructions (Addendum)
Urine is clear  No sign of infection ? ?I think this may be a muscle pull.  ? ?Hg a1c is  better   5.8!  Down to 6.2 . ?Stay on ozempic and take 0.5 mg per week    ?We can increase to 1.0 mg per week. After a month  ;let us know when you need the next dose.  ? ? ? ?

## 2022-01-17 DIAGNOSIS — E559 Vitamin D deficiency, unspecified: Secondary | ICD-10-CM | POA: Diagnosis not present

## 2022-01-17 DIAGNOSIS — R5383 Other fatigue: Secondary | ICD-10-CM | POA: Diagnosis not present

## 2022-01-17 DIAGNOSIS — E782 Mixed hyperlipidemia: Secondary | ICD-10-CM | POA: Diagnosis not present

## 2022-01-17 DIAGNOSIS — R7303 Prediabetes: Secondary | ICD-10-CM | POA: Diagnosis not present

## 2022-01-17 DIAGNOSIS — R7989 Other specified abnormal findings of blood chemistry: Secondary | ICD-10-CM | POA: Diagnosis not present

## 2022-02-03 DIAGNOSIS — J300 Vasomotor rhinitis: Secondary | ICD-10-CM | POA: Diagnosis not present

## 2022-02-03 DIAGNOSIS — J4540 Moderate persistent asthma, uncomplicated: Secondary | ICD-10-CM | POA: Diagnosis not present

## 2022-02-03 DIAGNOSIS — L2089 Other atopic dermatitis: Secondary | ICD-10-CM | POA: Diagnosis not present

## 2022-02-16 ENCOUNTER — Telehealth: Payer: Self-pay | Admitting: Internal Medicine

## 2022-02-16 NOTE — Telephone Encounter (Signed)
Spoke with patient to schedule Medicare Annual Wellness Visit (AWV) either virtually or in office.  ? ?Patient stated she had a lot going on and will call back to schedule ? ?Last AWV ;02/16/21 ? please schedule at anytime with [facility] Nurse Health Advisor 1 or 2 ? ? ? ?

## 2022-02-22 ENCOUNTER — Ambulatory Visit

## 2022-03-08 ENCOUNTER — Encounter: Payer: Medicare Other | Admitting: Obstetrics & Gynecology

## 2022-03-23 ENCOUNTER — Telehealth: Payer: Self-pay | Admitting: Internal Medicine

## 2022-03-23 NOTE — Telephone Encounter (Signed)
Reviewed chart & could not find that this was discussed with patient by PCP in past OV.  Called pt for clarification. She states she does not take any vaccines or new medications due to having multiple allergies; states PCP is very aware of this & her situation. Pt also states that while the cruise line is not requiring this letter she would like to make sure she has it available if needed. Advised that since PCP is out of office until June 14 & there is no other documentation in chart she will have to wait until PCP returns.

## 2022-03-23 NOTE — Telephone Encounter (Signed)
Pt requesting a letter explaining why she did not take the covid vaccine. Going on a cruise around the 18th of June.

## 2022-03-30 NOTE — Telephone Encounter (Signed)
Please compose a letter for her that says "because she has history and risk of multiple allergies she has not taken the COVID-19 vaccine."

## 2022-03-31 ENCOUNTER — Encounter: Payer: Self-pay | Admitting: Internal Medicine

## 2022-04-05 ENCOUNTER — Encounter: Payer: Self-pay | Admitting: Internal Medicine

## 2022-04-05 NOTE — Telephone Encounter (Signed)
Patient was informed letter is ready for pick up.

## 2022-04-05 NOTE — Telephone Encounter (Signed)
Patient called to follow up on the letter needed for her cruise. Patient needs it to be ready by Wednesday for pickup, the latest she could do is Thursday through [place] but really prefers a paper copy. She will need a call to know it is ready.    Please advise

## 2022-05-19 DIAGNOSIS — E782 Mixed hyperlipidemia: Secondary | ICD-10-CM | POA: Diagnosis not present

## 2022-05-19 DIAGNOSIS — R7303 Prediabetes: Secondary | ICD-10-CM | POA: Diagnosis not present

## 2022-05-19 DIAGNOSIS — R7989 Other specified abnormal findings of blood chemistry: Secondary | ICD-10-CM | POA: Diagnosis not present

## 2022-05-19 DIAGNOSIS — E559 Vitamin D deficiency, unspecified: Secondary | ICD-10-CM | POA: Diagnosis not present

## 2022-05-19 DIAGNOSIS — R5383 Other fatigue: Secondary | ICD-10-CM | POA: Diagnosis not present

## 2022-05-20 LAB — BASIC METABOLIC PANEL
BUN: 15 (ref 4–21)
CO2: 22 (ref 13–22)
Chloride: 104 (ref 99–108)
Creatinine: 0.9 (ref 0.5–1.1)
Glucose: 92
Potassium: 4.9 mEq/L (ref 3.5–5.1)
Sodium: 143 (ref 137–147)

## 2022-05-20 LAB — LIPID PANEL
Cholesterol: 264 — AB (ref 0–200)
HDL: 70 (ref 35–70)
LDL Cholesterol: 168

## 2022-05-20 LAB — CBC AND DIFFERENTIAL
HCT: 44 (ref 36–46)
Hemoglobin: 14.4 (ref 12.0–16.0)
Platelets: 229 10*3/uL (ref 150–400)
WBC: 7.7

## 2022-05-20 LAB — CBC: RBC: 5.05 (ref 3.87–5.11)

## 2022-05-20 LAB — COMPREHENSIVE METABOLIC PANEL
Calcium: 9.4 (ref 8.7–10.7)
eGFR: 65

## 2022-05-31 ENCOUNTER — Telehealth (INDEPENDENT_AMBULATORY_CARE_PROVIDER_SITE_OTHER): Payer: Medicare Other | Admitting: Internal Medicine

## 2022-05-31 VITALS — Ht 62.0 in | Wt 223.0 lb

## 2022-05-31 DIAGNOSIS — E785 Hyperlipidemia, unspecified: Secondary | ICD-10-CM | POA: Diagnosis not present

## 2022-05-31 DIAGNOSIS — Z5189 Encounter for other specified aftercare: Secondary | ICD-10-CM

## 2022-05-31 DIAGNOSIS — R7303 Prediabetes: Secondary | ICD-10-CM

## 2022-05-31 DIAGNOSIS — Z79899 Other long term (current) drug therapy: Secondary | ICD-10-CM

## 2022-05-31 MED ORDER — OZEMPIC (0.25 OR 0.5 MG/DOSE) 2 MG/3ML ~~LOC~~ SOPN: 0.5000 mg | PEN_INJECTOR | SUBCUTANEOUS | 1 refills | Status: DC

## 2022-05-31 NOTE — Progress Notes (Signed)
Virtual Visit via Video Note  I connected with Jill Burton on 06/05/22 at  3:00 PM EDT by a video enabled telemedicine application and verified that I am speaking with the correct person using two identifiers. Location patient: home Location provider:work  office Persons participating in the virtual visit: patient, provider  Patient aware  of the limitations of evaluation and management by telemedicine and  availability of in person appointments. and agreed to proceed.   HPI: Jill Burton presents for video visit  discuss ozempic  was on 0.25 then 0.5 and then went back down to 0.25 mg weekly( misunderstanding of doseing )  has had labs done from integrative provider   .Feels that she should hav lost more weight    232 to 223;  no sig se but is wary of potential.  Cost also an issue    Asthma management via  her  allergist.  Eczema doing ok   ROS: See pertinent positives and negatives per HPI.  Past Medical History:  Diagnosis Date   Allergic rhinitis    allergy testing neg in 2011   Anemia    Asthma    Hyperlipidemia    no meds   Hypertension    does not take bp meds on a regular basis   SVD (spontaneous vaginal delivery)    x 1    Past Surgical History:  Procedure Laterality Date   CESAREAN SECTION     x3   DILITATION & CURRETTAGE/HYSTROSCOPY WITH VERSAPOINT RESECTION N/A 11/21/2013   Procedure: DILATATION & CURETTAGE/HYSTEROSCOPY WITH VERSAPOINT RESECTION;  Surgeon: Marvene Staff, MD;  Location: [facility];  Service: Gynecology;  Laterality: N/A;   HERNIA REPAIR     as child   TUBAL LIGATION      Family History  Problem Relation Age of Onset   Asthma Other     Social History   Tobacco Use   Smoking status: Never   Smokeless tobacco: Never  Substance Use Topics   Alcohol use: No   Drug use: No      Current Outpatient Medications:    Albuterol Sulfate (PROAIR HFA IN), Inhale into the lungs. As needed, Disp: , Rfl:    Cholecalciferol (VITAMIN  D3) 250 MCG (10000 UT) capsule, See admin instructions., Disp: , Rfl:    fluticasone-salmeterol (ADVAIR) 250-50 MCG/ACT AEPB, inhale 1 dose by mouth twice daily, Disp: , Rfl:    hydrochlorothiazide (MICROZIDE) 12.5 MG capsule, Take 12.5 mg by mouth daily., Disp: , Rfl:    montelukast (SINGULAIR) 10 MG tablet, 1 TABLET, Disp: , Rfl:    Semaglutide,0.25 or 0.5MG/DOS, (OZEMPIC, 0.25 OR 0.5 MG/DOSE,) 2 MG/3ML SOPN, Inject 0.5 mg into the skin once a week., Disp: 3 mL, Rfl: 1   triamcinolone cream (KENALOG) 0.1 %, apply on skin twice a day, Disp: , Rfl:    Continuous Blood Gluc Sensor (FREESTYLE LIBRE SENSOR SYSTEM) MISC, Apply 1 kit topically daily., Disp: 1 each, Rfl: 0   vitamin C (ASCORBIC ACID) 500 MG tablet, Take 1,000 mg by mouth daily., Disp: , Rfl:   EXAM: BP Readings from Last 3 Encounters:  01/11/22 130/80  09/06/21 140/80  02/16/21 124/76    VITALS per patient if applicable:  GENERAL: alert, oriented, appears well and in no acute distress  HEENT: atraumatic, conjunttiva clear, no obvious abnormalities on inspection of external nose and ears  NECK: normal movements of the head and neck  LUNGS: on inspection no signs of respiratory distress, breathing rate appears normal,  no obvious gross SOB, gasping or wheezing  CV: no obvious cyanosis  MS: moves all visible extremities without noticeable abnormality  PSYCH/NEURO: pleasant and cooperative, no obvious depression or anxiety, speech and thought processing grossly intact Lab Results  Component Value Date   WBC 7.1 08/20/2021   HGB 13.8 08/20/2021   HCT 42.7 08/20/2021   PLT 208.0 08/20/2021   GLUCOSE 107 (H) 08/20/2021   CHOL 246 (H) 08/20/2021   TRIG 125.0 08/20/2021   HDL 61.00 08/20/2021   LDLDIRECT 157.4 09/23/2013   LDLCALC 160 (H) 08/20/2021   ALT 18 08/20/2021   AST 21 08/20/2021   NA 142 08/20/2021   K 4.6 08/20/2021   CL 106 08/20/2021   CREATININE 0.85 08/20/2021   BUN 14 08/20/2021   CO2 30 08/20/2021    TSH 1.44 08/20/2021   HGBA1C 5.8 (A) 01/11/2022  Laboratory panel reviewed from [facility] TC 264 hdl 70 ldl168 high particle ldl A1c 5.8 down from 6  Insulin resistance score 31 ASSESSMENT AND PLAN:  Discussed the following assessment and plan:    ICD-10-CM   1. Pre-diabetes  R73.03     2. Morbid obesity (HCC)  E66.01    232 to 223  but was on lower dose  after o.5 just went on a cruise     3. Hyperlipidemia, unspecified hyperlipidemia type  E78.5     4. Medication management  Z79.899     5. Alternative medicine  hx of homeopathy meds  Z51.89     Reviewed  labs  with her   and to be sent to scan  If possible I suggest staying on the Ozempic but getting the correct dose of 0.5 mg weekly with the hopes of increasing to 1 mg after a month Plan message or virtual in a month weight how medicine is doing Counseled.    Expectant management and discussion of plan and treatment with opportunity to ask questions and all were answered. The patient agreed with the plan and demonstrated an understanding of the instructions.   Advised to call back or seek an in-person evaluation if worsening  or having  further concerns  in interim. Return in about 1 month (around 07/01/2022) for Update weight medication evaluation/ can be a message or virtual visit.    Wanda Panosh, MD  

## 2022-06-05 ENCOUNTER — Encounter: Payer: Self-pay | Admitting: Internal Medicine

## 2022-06-14 ENCOUNTER — Encounter: Payer: Self-pay | Admitting: Internal Medicine

## 2022-06-14 NOTE — Progress Notes (Signed)
[facility]

## 2022-06-27 ENCOUNTER — Ambulatory Visit (INDEPENDENT_AMBULATORY_CARE_PROVIDER_SITE_OTHER)

## 2022-06-27 VITALS — Ht 62.0 in | Wt 224.0 lb

## 2022-06-27 DIAGNOSIS — Z0000 Encounter for general adult medical examination without abnormal findings: Secondary | ICD-10-CM | POA: Diagnosis not present

## 2022-06-27 NOTE — Patient Instructions (Signed)
Jill Burton , Thank you for taking time to come for your Medicare Wellness Visit. I appreciate your ongoing commitment to your health goals. Please review the following plan we discussed and let me know if I can assist you in the future.   Screening recommendations/referrals: Colonoscopy: not required Mammogram: not required Bone Density: declined Recommended yearly ophthalmology/optometry visit for glaucoma screening and checkup Recommended yearly dental visit for hygiene and checkup  Vaccinations: Influenza vaccine: declined Pneumococcal vaccine: declined Tdap vaccine: declined Shingles vaccine: declined   Covid-19: declined  Advanced directives: Please bring a copy of your POA (Power of Attorney) and/or Living Will to your next appointment.   Conditions/risks identified: none  Next appointment: Follow up in one year for your annual wellness visit    Preventive Care 65 Years and Older, Female Preventive care refers to lifestyle choices and visits with your health care provider that can promote health and wellness. What does preventive care include? A yearly physical exam. This is also called an annual well check. Dental exams once or twice a year. Routine eye exams. Ask your health care provider how often you should have your eyes checked. Personal lifestyle choices, including: Daily care of your teeth and gums. Regular physical activity. Eating a healthy diet. Avoiding tobacco and drug use. Limiting alcohol use. Practicing safe sex. Taking low-dose aspirin every day. Taking vitamin and mineral supplements as recommended by your health care provider. What happens during an annual well check? The services and screenings done by your health care provider during your annual well check will depend on your age, overall health, lifestyle risk factors, and family history of disease. Counseling  Your health care provider may ask you questions about your: Alcohol use. Tobacco  use. Drug use. Emotional well-being. Home and relationship well-being. Sexual activity. Eating habits. History of falls. Memory and ability to understand (cognition). Work and work Astronomer. Reproductive health. Screening  You may have the following tests or measurements: Height, weight, and BMI. Blood pressure. Lipid and cholesterol levels. These may be checked every 5 years, or more frequently if you are over 23 years old. Skin check. Lung cancer screening. You may have this screening every year starting at age 99 if you have a 30-pack-year history of smoking and currently smoke or have quit within the past 15 years. Fecal occult blood test (FOBT) of the stool. You may have this test every year starting at age 19. Flexible sigmoidoscopy or colonoscopy. You may have a sigmoidoscopy every 5 years or a colonoscopy every 10 years starting at age 19. Hepatitis C blood test. Hepatitis B blood test. Sexually transmitted disease (STD) testing. Diabetes screening. This is done by checking your blood sugar (glucose) after you have not eaten for a while (fasting). You may have this done every 1-3 years. Bone density scan. This is done to screen for osteoporosis. You may have this done starting at age 64. Mammogram. This may be done every 1-2 years. Talk to your health care provider about how often you should have regular mammograms. Talk with your health care provider about your test results, treatment options, and if necessary, the need for more tests. Vaccines  Your health care provider may recommend certain vaccines, such as: Influenza vaccine. This is recommended every year. Tetanus, diphtheria, and acellular pertussis (Tdap, Td) vaccine. You may need a Td booster every 10 years. Zoster vaccine. You may need this after age 40. Pneumococcal 13-valent conjugate (PCV13) vaccine. One dose is recommended after age 86. Pneumococcal polysaccharide (PPSV23)  vaccine. One dose is recommended  after age 74. Talk to your health care provider about which screenings and vaccines you need and how often you need them. This information is not intended to replace advice given to you by your health care provider. Make sure you discuss any questions you have with your health care provider. Document Released: 10/30/2015 Document Revised: 06/22/2016 Document Reviewed: 08/04/2015 Elsevier Interactive Patient Education  2017 [place] Prevention in the Home Falls can cause injuries. They can happen to people of all ages. There are many things you can do to make your home safe and to help prevent falls. What can I do on the outside of my home? Regularly fix the edges of walkways and driveways and fix any cracks. Remove anything that might make you trip as you walk through a door, such as a raised step or threshold. Trim any bushes or trees on the path to your home. Use bright outdoor lighting. Clear any walking paths of anything that might make someone trip, such as rocks or tools. Regularly check to see if handrails are loose or broken. Make sure that both sides of any steps have handrails. Any raised decks and porches should have guardrails on the edges. Have any leaves, snow, or ice cleared regularly. Use sand or salt on walking paths during winter. Clean up any spills in your garage right away. This includes oil or grease spills. What can I do in the bathroom? Use night lights. Install grab bars by the toilet and in the tub and shower. Do not use towel bars as grab bars. Use non-skid mats or decals in the tub or shower. If you need to sit down in the shower, use a plastic, non-slip stool. Keep the floor dry. Clean up any water that spills on the floor as soon as it happens. Remove soap buildup in the tub or shower regularly. Attach bath mats securely with double-sided non-slip rug tape. Do not have throw rugs and other things on the floor that can make you trip. What can I do  in the bedroom? Use night lights. Make sure that you have a light by your bed that is easy to reach. Do not use any sheets or blankets that are too big for your bed. They should not hang down onto the floor. Have a firm chair that has side arms. You can use this for support while you get dressed. Do not have throw rugs and other things on the floor that can make you trip. What can I do in the kitchen? Clean up any spills right away. Avoid walking on wet floors. Keep items that you use a lot in easy-to-reach places. If you need to reach something above you, use a strong step stool that has a grab bar. Keep electrical cords out of the way. Do not use floor polish or wax that makes floors slippery. If you must use wax, use non-skid floor wax. Do not have throw rugs and other things on the floor that can make you trip. What can I do with my stairs? Do not leave any items on the stairs. Make sure that there are handrails on both sides of the stairs and use them. Fix handrails that are broken or loose. Make sure that handrails are as long as the stairways. Check any carpeting to make sure that it is firmly attached to the stairs. Fix any carpet that is loose or worn. Avoid having throw rugs at the top or bottom  of the stairs. If you do have throw rugs, attach them to the floor with carpet tape. Make sure that you have a light switch at the top of the stairs and the bottom of the stairs. If you do not have them, ask someone to add them for you. What else can I do to help prevent falls? Wear shoes that: Do not have high heels. Have rubber bottoms. Are comfortable and fit you well. Are closed at the toe. Do not wear sandals. If you use a stepladder: Make sure that it is fully opened. Do not climb a closed stepladder. Make sure that both sides of the stepladder are locked into place. Ask someone to hold it for you, if possible. Clearly mark and make sure that you can see: Any grab bars or  handrails. First and last steps. Where the edge of each step is. Use tools that help you move around (mobility aids) if they are needed. These include: Canes. Walkers. Scooters. Crutches. Turn on the lights when you go into a dark area. Replace any light bulbs as soon as they burn out. Set up your furniture so you have a clear path. Avoid moving your furniture around. If any of your floors are uneven, fix them. If there are any pets around you, be aware of where they are. Review your medicines with your doctor. Some medicines can make you feel dizzy. This can increase your chance of falling. Ask your doctor what other things that you can do to help prevent falls. This information is not intended to replace advice given to you by your health care provider. Make sure you discuss any questions you have with your health care provider. Document Released: 07/30/2009 Document Revised: 03/10/2016 Document Reviewed: 11/07/2014 Elsevier Interactive Patient Education  2017 [employer].

## 2022-06-27 NOTE — Progress Notes (Signed)
I connected with Jill Burton today by telephone and verified that I am speaking with the correct person using two identifiers. Location patient: home Location provider: work Persons participating in the virtual visit: [place], [place] LPN.   I discussed the limitations, risks, security and privacy concerns of performing an evaluation and management service by telephone and the availability of in person appointments. I also discussed with the patient that there may be a patient responsible charge related to this service. The patient expressed understanding and verbally consented to this telephonic visit.    Interactive audio and video telecommunications were attempted between this provider and patient, however failed, due to patient having technical difficulties OR patient did not have access to video capability.  We continued and completed visit with audio only.     Vital signs may be patient reported or missing.  Subjective:   Jill Burton is a 76 y.o. female who presents for Medicare Annual (Subsequent) preventive examination.  Review of Systems     Cardiac Risk Factors include: advanced age (>68men, >52 women);hypertension;obesity (BMI >30kg/m2)     Objective:    Today's Vitals   06/27/22 0956  Weight: 224 lb (101.6 kg)  Height: 5\' 2"  (1.575 m)   Body mass index is 40.97 kg/m.     06/27/2022   10:00 AM 02/16/2021    9:51 AM 10/12/2020    9:44 AM 11/19/2013   10:14 AM  Advanced Directives  Does Patient Have a Medical Advance Directive? Yes Yes Yes Patient does not have advance directive  Type of Advance Directive [place];Living will Living will    Does patient want to make changes to medical advance directive?   No - Patient declined   Copy of [place] in Chart? No - copy requested       Current Medications (verified) Outpatient Encounter Medications as of 06/27/2022  Medication Sig   Albuterol Sulfate (PROAIR  HFA IN) Inhale into the lungs. As needed   Cholecalciferol (VITAMIN D3) 250 MCG (10000 UT) capsule See admin instructions.   fluticasone-salmeterol (ADVAIR) 250-50 MCG/ACT AEPB inhale 1 dose by mouth twice daily   hydrochlorothiazide (MICROZIDE) 12.5 MG capsule Take 12.5 mg by mouth daily.   montelukast (SINGULAIR) 10 MG tablet 1 TABLET   Continuous Blood Gluc Sensor (FREESTYLE LIBRE SENSOR SYSTEM) MISC Apply 1 kit topically daily.   Semaglutide,0.25 or 0.5MG /DOS, (OZEMPIC, 0.25 OR 0.5 MG/DOSE,) 2 MG/3ML SOPN Inject 0.5 mg into the skin once a week. (Patient not taking: Reported on 06/27/2022)   triamcinolone cream (KENALOG) 0.1 % apply on skin twice a day   vitamin C (ASCORBIC ACID) 500 MG tablet Take 1,000 mg by mouth daily.   No facility-administered encounter medications on file as of 06/27/2022.    Allergies (verified) Penicillins, Sulfacetamide sodium, and Sulfonamide derivatives   History: Past Medical History:  Diagnosis Date   Allergic rhinitis    allergy testing neg in 2011   Anemia    Asthma    Hyperlipidemia    no meds   Hypertension    does not take bp meds on a regular basis   SVD (spontaneous vaginal delivery)    x 1   Past Surgical History:  Procedure Laterality Date   CESAREAN SECTION     x3   DILITATION & CURRETTAGE/HYSTROSCOPY WITH VERSAPOINT RESECTION N/A 11/21/2013   Procedure: DILATATION & CURETTAGE/HYSTEROSCOPY WITH VERSAPOINT RESECTION;  Surgeon: Marvene Staff, MD;  Location: [facility];  Service: Gynecology;  Laterality: N/A;   HERNIA REPAIR     as child   TUBAL LIGATION     Family History  Problem Relation Age of Onset   Asthma Other    Social History   Socioeconomic History   Marital status: Married    Spouse name: Not on file   Number of children: Not on file   Years of education: Not on file   Highest education level: Not on file  Occupational History   Not on file  Tobacco Use   Smoking status: Never   Smokeless tobacco: Never   Vaping Use   Vaping Use: Never used  Substance and Sexual Activity   Alcohol use: No   Drug use: No   Sexual activity: Yes    Birth control/protection: Surgical  Other Topics Concern   Not on file  Social History Narrative   Married   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      hh of 2    Social Determinants of Health   Financial Resource Strain: Low Risk  (06/27/2022)   Overall Financial Resource Strain (CARDIA)    Difficulty of Paying Living Expenses: Not hard at all  Food Insecurity: No Food Insecurity (06/27/2022)   Hunger Vital Sign    Worried About Running Out of Food in the Last Year: Never true    [place] in the Last Year: Never true  Transportation Needs: No Transportation Needs (06/27/2022)   PRAPARE - Hydrologist (Medical): No    Lack of Transportation (Non-Medical): No  Physical Activity: Inactive (06/27/2022)   Exercise Vital Sign    Days of Exercise per Week: 0 days    Minutes of Exercise per Session: 0 min  Stress: No Stress Concern Present (06/27/2022)   [place]    Feeling of Stress : Not at all  Social Connections: Moderately Integrated (02/16/2021)   Social Connection and Isolation Panel [NHANES]    Frequency of Communication with Friends and Family: Once a week    Frequency of Social Gatherings with Friends and Family: More than three times a week    Attends Religious Services: More than 4 times per year    Active Member of [employer] or Organizations: No    Attends Music therapist: Never    Marital Status: Married    Tobacco Counseling Counseling given: Not Answered   Clinical Intake:  Pre-visit preparation completed: Yes  Pain : No/denies pain     Nutritional Status: BMI > 30  Obese Nutritional Risks: None Diabetes: No  How often do you need to have someone help you when you read  instructions, pamphlets, or other written materials from your doctor or pharmacy?: 1 - Never What is the last grade level you completed in school?: 1 1/2 yrs college  Diabetic? no  Interpreter Needed?: No  Information entered by :: NAllen LPN   Activities of Daily Living    06/27/2022   10:01 AM  In your present state of health, do you have any difficulty performing the following activities:  Hearing? 0  Vision? 0  Difficulty concentrating or making decisions? 0  Walking or climbing stairs? 0  Dressing or bathing? 0  Doing errands, shopping? 0  Preparing Food and eating ? N  Using the Toilet? N  In the past six months, have you accidently leaked urine? N  Do you have problems with loss of bowel control? N  Managing your Medications? N  Managing your Finances? N  Housekeeping or managing your Housekeeping? N    Patient Care Team: Panosh, Standley Brooking, MD as PCP - General Tiajuana Amass, MD as Referring Physician (Allergy and Immunology)  Indicate any recent Medical Services you may have received from other than [facility] providers in the past year (date may be approximate).     Assessment:   This is a routine wellness examination for Jill Burton.  Hearing/Vision screen Vision Screening - Comments:: Regular eye exams,   Dietary issues and exercise activities discussed: Current Exercise Habits: The patient does not participate in regular exercise at present   Goals Addressed             This Visit's Progress    Patient Stated       06/27/2022, wants to lose weight       Depression Screen    06/27/2022   10:01 AM 05/31/2022    3:01 PM 02/16/2021    9:49 AM 10/12/2020    9:44 AM 09/19/2018   12:12 PM 06/03/2015    2:26 PM  PHQ 2/9 Scores  PHQ - 2 Score 0 0 0 0 0 0  PHQ- 9 Score  2        Fall Risk    06/27/2022   10:01 AM 05/31/2022    3:03 PM 02/16/2021    9:52 AM 10/12/2020    9:44 AM 09/19/2018   12:11 PM  Fall Risk   Falls in the past year? 0 0 0 0 0  Number falls  in past yr: 0 0 0    Injury with Fall? 0 0 0    Risk for fall due to : Medication side effect No Fall Risks Impaired vision    Follow up Falls prevention discussed;Education provided;Falls evaluation completed Falls evaluation completed Falls prevention discussed      FALL RISK PREVENTION PERTAINING TO THE HOME:  Any stairs in or around the home? No  If so, are there any without handrails? N/a Home free of loose throw rugs in walkways, pet beds, electrical cords, etc? Yes  Adequate lighting in your home to reduce risk of falls? Yes   ASSISTIVE DEVICES UTILIZED TO PREVENT FALLS:  Life alert? No  Use of a cane, walker or w/c? No  Grab bars in the bathroom? No  Shower chair or bench in shower? No  Elevated toilet seat or a handicapped toilet? Yes   TIMED UP AND GO:  Was the test performed? No .       Cognitive Function:        Immunizations  There is no immunization history on file for this patient.  TDAP status: Due, Education has been provided regarding the importance of this vaccine. Advised may receive this vaccine at local pharmacy or Health Dept. Aware to provide a copy of the vaccination record if obtained from local pharmacy or Health Dept. Verbalized acceptance and understanding.  Flu Vaccine status: Declined, Education has been provided regarding the importance of this vaccine but patient still declined. Advised may receive this vaccine at local pharmacy or Health Dept. Aware to provide a copy of the vaccination record if obtained from local pharmacy or Health Dept. Verbalized acceptance and understanding.  Pneumococcal vaccine status: Declined,  Education has been provided regarding the importance of this vaccine but patient still declined. Advised may receive this vaccine at local pharmacy or Health Dept. Aware to provide  a copy of the vaccination record if obtained from local pharmacy or Health Dept. Verbalized acceptance and understanding.   Covid-19 vaccine  status: Declined, Education has been provided regarding the importance of this vaccine but patient still declined. Advised may receive this vaccine at local pharmacy or Health Dept.or vaccine clinic. Aware to provide a copy of the vaccination record if obtained from local pharmacy or Health Dept. Verbalized acceptance and understanding.  Qualifies for Shingles Vaccine? Yes   Zostavax completed No   Shingrix Completed?: No.    Education has been provided regarding the importance of this vaccine. Patient has been advised to call insurance company to determine out of pocket expense if they have not yet received this vaccine. Advised may also receive vaccine at local pharmacy or Health Dept. Verbalized acceptance and understanding.  Screening Tests Health Maintenance  Topic Date Due   COVID-19 Vaccine (1) Never done   Hepatitis C Screening  Never done   TETANUS/TDAP  Never done   Zoster Vaccines- Shingrix (1 of 2) Never done   COLONOSCOPY (Pts 45-75yrs Insurance coverage will need to be confirmed)  Never done   Pneumonia Vaccine 34+ Years old (1 - PCV) Never done   DEXA SCAN  Never done   INFLUENZA VACCINE  01/16/2023 (Originally 05/17/2022)   HPV VACCINES  Aged Out    Health Maintenance  Health Maintenance Due  Topic Date Due   COVID-19 Vaccine (1) Never done   Hepatitis C Screening  Never done   TETANUS/TDAP  Never done   Zoster Vaccines- Shingrix (1 of 2) Never done   COLONOSCOPY (Pts 45-54yrs Insurance coverage will need to be confirmed)  Never done   Pneumonia Vaccine 42+ Years old (1 - PCV) Never done   DEXA SCAN  Never done    Colorectal cancer screening: No longer required.   Mammogram status: No longer required due to age.  Bone Density status: decline  Lung Cancer Screening: (Low Dose CT Chest recommended if Age 36-80 years, 30 pack-year currently smoking OR have quit w/in 15years.) does not qualify.   Lung Cancer Screening Referral: no  Additional  Screening:  Hepatitis C Screening: does qualify;   Vision Screening: Recommended annual ophthalmology exams for early detection of glaucoma and other disorders of the eye. Is the patient up to date with their annual eye exam?  Yes  Who is the provider or what is the name of the office in which the patient attends annual eye exams? Can't remember name If pt is not established with a provider, would they like to be referred to a provider to establish care? No .   Dental Screening: Recommended annual dental exams for proper oral hygiene  Community Resource Referral / Chronic Care Management: CRR required this visit?  No   CCM required this visit?  No      Plan:     I have personally reviewed and noted the following in the patient's chart:   Medical and social history Use of alcohol, tobacco or illicit drugs  Current medications and supplements including opioid prescriptions. Patient is not currently taking opioid prescriptions. Functional ability and status Nutritional status Physical activity Advanced directives List of other physicians Hospitalizations, surgeries, and ER visits in previous 12 months Vitals Screenings to include cognitive, depression, and falls Referrals and appointments  In addition, I have reviewed and discussed with patient certain preventive protocols, quality metrics, and best practice recommendations. A written personalized care plan for preventive services as well as general preventive  health recommendations were provided to patient.     Kellie Simmering, LPN   4/97/5300   Nurse Notes: 6 CIT not administered. Declined. Patient states that she does not have any cognitive issues.  Due to this being a virtual visit, the after visit summary with patients personalized plan was offered to patient via mail or my-chart.  Patient would like to access on my-chart

## 2022-06-30 ENCOUNTER — Other Ambulatory Visit: Payer: Self-pay | Admitting: Internal Medicine

## 2022-07-01 NOTE — Telephone Encounter (Signed)
Attempt to reach patient in regards to sending in Voltarin for arthritis. Voice mail is not available.

## 2022-07-02 ENCOUNTER — Other Ambulatory Visit: Payer: Self-pay | Admitting: Internal Medicine

## 2022-07-06 ENCOUNTER — Other Ambulatory Visit: Payer: Self-pay

## 2022-07-06 NOTE — Telephone Encounter (Signed)
Ok to refill the hctz for 6 months   The inhalers should be rx by her allergy asthma specialist so deny that .   Voltaren gel 4 grams to joint qid  disp 100 gram refill x 1  this is otc otherwise

## 2022-07-06 NOTE — Telephone Encounter (Signed)
Pt is returning karpuih call 

## 2022-07-07 ENCOUNTER — Other Ambulatory Visit: Payer: Self-pay

## 2022-07-07 MED ORDER — HYDROCHLOROTHIAZIDE 12.5 MG PO CAPS: 12.5000 mg | ORAL_CAPSULE | Freq: Every day | ORAL | 1 refills | Status: DC

## 2022-07-07 MED ORDER — DICLOFENAC SODIUM 1 % EX GEL: 4.0000 g | Freq: Four times a day (QID) | CUTANEOUS | 1 refills | Status: DC

## 2022-08-23 ENCOUNTER — Encounter: Payer: Self-pay | Admitting: Internal Medicine

## 2022-08-23 ENCOUNTER — Telehealth (INDEPENDENT_AMBULATORY_CARE_PROVIDER_SITE_OTHER): Payer: Medicare Other | Admitting: Internal Medicine

## 2022-08-23 VITALS — Ht 62.0 in | Wt 224.0 lb

## 2022-08-23 DIAGNOSIS — Z79899 Other long term (current) drug therapy: Secondary | ICD-10-CM

## 2022-08-23 DIAGNOSIS — I10 Essential (primary) hypertension: Secondary | ICD-10-CM | POA: Diagnosis not present

## 2022-08-23 DIAGNOSIS — R202 Paresthesia of skin: Secondary | ICD-10-CM

## 2022-08-23 DIAGNOSIS — R7303 Prediabetes: Secondary | ICD-10-CM

## 2022-08-23 MED ORDER — SEMAGLUTIDE-WEIGHT MANAGEMENT 0.5 MG/0.5ML ~~LOC~~ SOAJ
0.5000 mg | SUBCUTANEOUS | 1 refills | Status: DC
Start: 2022-08-23 — End: 2022-09-21

## 2022-08-23 NOTE — Progress Notes (Signed)
Virtual Visit via Video Note  I connected with Jill Burton on 08/23/22 at  3:00 PM EST by a video enabled telemedicine application and verified that I am speaking with the correct person using two identifiers. Location patient: home Location provider:work office Persons participating in the virtual visit: patient, provider  Patient aware  of the limitations of evaluation and management by telemedicine and  availability of in person appointments. and agreed to proceed.   HPI: Jill Burton presents for video visit for a number of reasons  Off and on numb feeling near right face over jaw line but no motor sx  off and on for 1-2  week  has been using Voltaren on right shoulder and wonders if related  no other  neuro sx  Bp 140s sometimes 150s  Ozempic too expensive and not covered any other options   has been helpful for her so far   Last  dosing was 0.5 mg  per week.  Daughter suggested may be hormone levels  to check? Other lab updated   Never got the free style ( not covered ) but other optinos to check bg ? ROS: See pertinent positives and negatives per HPI.  Past Medical History:  Diagnosis Date   Allergic rhinitis    allergy testing neg in 2011   Anemia    Asthma    Hyperlipidemia    no meds   Hypertension    does not take bp meds on a regular basis   SVD (spontaneous vaginal delivery)    x 1    Past Surgical History:  Procedure Laterality Date   CESAREAN SECTION     x3   DILITATION & CURRETTAGE/HYSTROSCOPY WITH VERSAPOINT RESECTION N/A 11/21/2013   Procedure: DILATATION & CURETTAGE/HYSTEROSCOPY WITH VERSAPOINT RESECTION;  Surgeon: Serita Kyle, MD;  Location: [facility];  Service: Gynecology;  Laterality: N/A;   HERNIA REPAIR     as child   TUBAL LIGATION      Family History  Problem Relation Age of Onset   Asthma Other     Social History   Tobacco Use   Smoking status: Never   Smokeless tobacco: Never  Vaping Use   Vaping Use: Never used   Substance Use Topics   Alcohol use: No   Drug use: No      Current Outpatient Medications:    Albuterol Sulfate (PROAIR HFA IN), Inhale into the lungs. As needed, Disp: , Rfl:    Cholecalciferol (VITAMIN D3) 250 MCG (10000 UT) capsule, See admin instructions., Disp: , Rfl:    diclofenac Sodium (VOLTAREN) 1 % GEL, Apply 4 g topically 4 (four) times daily., Disp: 100 g, Rfl: 1   fluticasone-salmeterol (ADVAIR) 250-50 MCG/ACT AEPB, inhale 1 dose by mouth twice daily, Disp: , Rfl:    hydrochlorothiazide (MICROZIDE) 12.5 MG capsule, Take 1 capsule (12.5 mg total) by mouth daily. (Patient taking differently: Take 12.5 mg by mouth daily. PRN), Disp: 90 capsule, Rfl: 1   montelukast (SINGULAIR) 10 MG tablet, 1 TABLET, Disp: , Rfl:    Semaglutide-Weight Management 0.5 MG/0.5ML SOAJ, Inject 0.5 mg into the skin once a week., Disp: 2 mL, Rfl: 1   vitamin C (ASCORBIC ACID) 500 MG tablet, Take 1,000 mg by mouth daily., Disp: , Rfl:    triamcinolone cream (KENALOG) 0.1 %, apply on skin twice a day (Patient not taking: Reported on 08/23/2022), Disp: , Rfl:   EXAM: BP Readings from Last 3 Encounters:  01/11/22 130/80  09/06/21 140/80  02/16/21 124/76    VITALS per patient if applicable:  GENERAL: alert, oriented, appears well and in no acute distress face symmetrical looks well nl speech  points to r jaw mandibular area of area of tingling ?   HEENT: atraumatic, conjunttiva clear, no obvious abnormalities on inspection of external nose and ears  NECK: normal movements of the head and neck  LUNGS: on inspection no signs of respiratory distress, breathing rate appears normal, no obvious gross SOB, gasping or wheezing  CV: no obvious cyanosis  MS: moves all visible extremities without noticeable abnormality  PSYCH/NEURO: pleasant and cooperative, no obvious depression or anxiety, speech and thought processing grossly intact Lab Results  Component Value Date   WBC 7.7 05/20/2022   HGB 14.4  05/20/2022   HCT 44 05/20/2022   PLT 229 05/20/2022   GLUCOSE 107 (H) 08/20/2021   CHOL 264 (A) 05/19/2022   TRIG 125.0 08/20/2021   HDL 70 05/19/2022   LDLDIRECT 157.4 09/23/2013   LDLCALC 168 05/19/2022   ALT 18 08/20/2021   AST 21 08/20/2021   NA 143 05/20/2022   K 4.9 05/20/2022   CL 104 05/20/2022   CREATININE 0.9 05/20/2022   BUN 15 05/20/2022   CO2 22 05/20/2022   TSH 1.44 08/20/2021   HGBA1C 5.8 (A) 01/11/2022   Lab Results  Component Value Date   VITAMINB12 890 07/22/2019    ASSESSMENT AND PLAN:  Discussed the following assessment and plan:    ICD-10-CM   1. Pre-diabetes  R73.03 TSH    Hemoglobin A1c    T4, free    Comprehensive metabolic panel    Sedimentation rate    C-reactive protein    2. Medication management  Z79.899 TSH    Hemoglobin A1c    T4, free    Comprehensive metabolic panel    Sedimentation rate    C-reactive protein    3. Morbid obesity (HCC)  E66.01 TSH    Hemoglobin A1c    T4, free    Comprehensive metabolic panel    Sedimentation rate    C-reactive protein    4. Essential hypertension  I10 TSH    Hemoglobin A1c    T4, free    Comprehensive metabolic panel    Sedimentation rate    C-reactive protein    5. Tingling sensation in face  R20.2 TSH    Hemoglobin A1c    T4, free    Comprehensive metabolic panel    Sedimentation rate    C-reactive protein     Stop the Voltaren for week to see if sx resolve( right shoulder)  if  persistent or progressive may need further work up.  And plan fu  Updated bg lab Sample of free style libre trial. Make sure bp controlled   Counseled.   Expectant management and discussion of plan and treatment with opportunity to ask questions and all were answered. The patient agreed with the plan and demonstrated an understanding of the instructions.   Advised to call back or seek an in-person evaluation if worsening  or having  further concerns  in interim. Return for depending on results and  how doing  medication options and if worse .    Shanon Ace, MD

## 2022-08-26 ENCOUNTER — Other Ambulatory Visit: Payer: Self-pay

## 2022-08-26 MED ORDER — FREESTYLE LIBRE 2 SENSOR MISC: 1.0000 | 2 refills | Status: DC

## 2022-09-21 ENCOUNTER — Ambulatory Visit (INDEPENDENT_AMBULATORY_CARE_PROVIDER_SITE_OTHER): Payer: Medicare Other | Admitting: Family Medicine

## 2022-09-21 ENCOUNTER — Encounter: Payer: Self-pay | Admitting: Family Medicine

## 2022-09-21 VITALS — BP 142/70 | HR 97 | Temp 99.2°F | Ht 62.0 in | Wt 224.1 lb

## 2022-09-21 DIAGNOSIS — J45901 Unspecified asthma with (acute) exacerbation: Secondary | ICD-10-CM | POA: Diagnosis not present

## 2022-09-21 MED ORDER — ALBUTEROL SULFATE (2.5 MG/3ML) 0.083% IN NEBU: 2.5000 mg | INHALATION_SOLUTION | Freq: Four times a day (QID) | RESPIRATORY_TRACT | 1 refills | Status: AC | PRN

## 2022-09-21 MED ORDER — METHYLPREDNISOLONE ACETATE 80 MG/ML IJ SUSP
80.0000 mg | Freq: Once | INTRAMUSCULAR | Status: AC
  Administered 2022-09-21: 80 mg via INTRAMUSCULAR

## 2022-09-21 MED ORDER — PREDNISONE 20 MG PO TABS: ORAL_TABLET | ORAL | 0 refills | Status: DC

## 2022-09-21 MED ORDER — BENZONATATE 100 MG PO CAPS: 100.0000 mg | ORAL_CAPSULE | Freq: Three times a day (TID) | ORAL | 0 refills | Status: DC | PRN

## 2022-09-21 MED ORDER — METHYLPREDNISOLONE SODIUM SUCC 40 MG IJ SOLR: 80.0000 mg | Freq: Once | INTRAMUSCULAR | Status: DC

## 2022-09-21 NOTE — Progress Notes (Signed)
Established Patient Office Visit  Subjective   Patient ID: Jill Burton, female    DOB: 03-May-1946  Age: 76 y.o. MRN: [id number]  Chief Complaint  Patient presents with   Cough    Productive with clear sputum and chest congestion x4 days, tried Mucinex DM with no relief, also used inhalers with no relief, also states she used left over Prednisone and Doxycycline with no relief   Shortness of Breath    X4-5 days    Patient has a history of asthma and uses inhalers regularly, states she was possibly exposed to sick contacts at a doctor's office last week, states that she tested herself for COVID at home and it was negative. States she had some leftover prednisone and doxycycline and she took that at home (10 mg daily for 6 days). Has also taken claritin D at home yesterday.  Cough This is a new problem. The current episode started in the past 7 days. The problem has been unchanged. The problem occurs constantly. The cough is Productive of sputum. Associated symptoms include rhinorrhea and shortness of breath. Pertinent negatives include no chest pain, chills, ear pain, fever or sore throat. Nothing aggravates the symptoms. She has tried OTC cough suppressant, a beta-agonist inhaler and oral steroids for the symptoms. The treatment provided no relief. Her past medical history is significant for asthma.  Shortness of Breath Associated symptoms include rhinorrhea. Pertinent negatives include no chest pain, ear pain, fever or sore throat. Her past medical history is significant for asthma.   Current Outpatient Medications  Medication Instructions   albuterol (PROVENTIL) 2.5 mg, Nebulization, Every 6 hours PRN   Albuterol Sulfate (PROAIR HFA IN) Inhalation, As needed   ascorbic acid (VITAMIN C) 1,000 mg, Oral, Daily   benzonatate (TESSALON PERLES) 100 mg, Oral, 3 times daily PRN   Cholecalciferol (VITAMIN D3) 250 MCG (10000 UT) capsule See admin instructions   diclofenac Sodium (VOLTAREN) 4  g, Topical, 4 times daily   fluticasone-salmeterol (ADVAIR) 250-50 MCG/ACT AEPB inhale 1 dose by mouth twice daily   hydrochlorothiazide (MICROZIDE) 12.5 mg, Oral, Daily   montelukast (SINGULAIR) 10 MG tablet 1 TABLET   predniSONE (DELTASONE) 20 MG tablet Take 3 tablets (60 mg total) by mouth daily with breakfast for 2 days, THEN 2 tablets (40 mg total) daily with breakfast for 2 days, THEN 1 tablet (20 mg total) daily with breakfast for 2 days, THEN 0.5 tablets (10 mg total) daily with breakfast for 2 days.    Patient Active Problem List   Diagnosis Date Noted   Atopic dermatitis 07/08/2021   Moderate persistent asthma, uncomplicated 07/08/2021   Vasomotor rhinitis 07/08/2021   Pre-diabetes 10/12/2020   Sleep difficulties 03/21/2014   Musculoskeletal pain 03/21/2014   Hip pain 09/23/2013   Weight gain 09/23/2013   Essential hypertension 04/16/2013   Alternative medicine  hx of homeopathy meds  04/12/2012   Obesity 04/12/2012   Fatigue 04/17/2011   THYROID STIMULATING HORMONE, ABNORMAL 08/25/2008   HYPERGLYCEMIA 08/25/2008   INSOMNIA-SLEEP DISORDER-UNSPEC 08/21/2007   Allergic rhinitis 08/21/2007   ASTHMA 08/21/2007      Review of Systems  Constitutional:  Negative for chills and fever.  HENT:  Positive for rhinorrhea. Negative for ear pain and sore throat.   Respiratory:  Positive for cough and shortness of breath.   Cardiovascular:  Negative for chest pain.  All other systems reviewed and are negative.     Objective:     BP (!) 142/70 (BP Location: Left Arm,  Patient Position: Sitting, Cuff Size: Large)   Pulse 97   Temp 99.2 F (37.3 C) (Oral)   Ht 5\' 2"  (1.575 m)   Wt 224 lb 1.6 oz (101.7 kg)   SpO2 95%   BMI 40.99 kg/m  BP Readings from Last 3 Encounters:  09/21/22 (!) 142/70  01/11/22 130/80  09/06/21 140/80      Physical Exam Vitals reviewed.  Constitutional:      Appearance: Normal appearance. She is well-developed and well-groomed. She is obese.   HENT:     Right Ear: Tympanic membrane normal.     Left Ear: Tympanic membrane normal.     Mouth/Throat:     Mouth: Mucous membranes are moist.     Pharynx: Oropharynx is clear. No pharyngeal swelling or oropharyngeal exudate.  Eyes:     Conjunctiva/sclera: Conjunctivae normal.  Neck:     Thyroid: No thyromegaly.  Cardiovascular:     Rate and Rhythm: Normal rate and regular rhythm.     Pulses: Normal pulses.     Heart sounds: S1 normal and S2 normal.  Pulmonary:     Effort: Pulmonary effort is normal.     Breath sounds: Normal air entry. Examination of the right-upper field reveals wheezing. Examination of the left-upper field reveals wheezing. Examination of the right-middle field reveals wheezing. Examination of the left-middle field reveals wheezing. Examination of the right-lower field reveals wheezing. Examination of the left-lower field reveals wheezing. Wheezing present.  Abdominal:     General: Bowel sounds are normal.  Musculoskeletal:     Right lower leg: No edema.     Left lower leg: No edema.  Neurological:     Mental Status: She is alert and oriented to person, place, and time. Mental status is at baseline.     Gait: Gait is intact.  Psychiatric:        Mood and Affect: Mood and affect normal.        Speech: Speech normal.        Behavior: Behavior normal.        Judgment: Judgment normal.      No results found for any visits on 09/21/22.    The 10-year ASCVD risk score (Arnett DK, et al., 2019) is: 24.3%* (Cholesterol units were assumed)    Assessment & Plan:   Problem List Items Addressed This Visit   None Visit Diagnoses     Moderate acute exacerbation of asthma    -  Primary   Relevant Medications   Diffuse wheezing, inspiratory and expiratory in all lung fields. I recommend a course of steroids, starting with 80 mg injection of depo-medrol today 80 mg IM x 1, then taper the dose, starting with 60 mg and decreasing by 20 mg every 2 days until  complete. I also refilled her nebulizer treatments and advised her to use these while acutely ill. PRN tessalon perles, 100 mg TID PRN to help ease the cough. Do not think that abx are indicated at this time.   methylPREDNISolone sodium succinate (SOLU-MEDROL) 40 mg/mL injection 80 mg (Start on 09/21/2022  9:30 AM)   predniSONE (DELTASONE) 20 MG tablet   albuterol (PROVENTIL) (2.5 MG/3ML) 0.083% nebulizer solution   benzonatate (TESSALON PERLES) 100 MG capsule       No follow-ups on file.    14/03/2022, MD

## 2022-09-23 ENCOUNTER — Telehealth: Payer: Self-pay | Admitting: Internal Medicine

## 2022-09-23 NOTE — Telephone Encounter (Signed)
I don't think there is anything stronger than breathing treatments and steroids that breaks up mucus, especially if she is still taking the mucinex.

## 2022-09-23 NOTE — Telephone Encounter (Signed)
Spoke with the patient and informed her of the message below.  Patient states she has been coughing and questioned if she could take Mucinex with the cough pills.  I advised the patient she can take plain Mucinex with Benzonatate and advised she seek treatment at an urgent care over the weekend if needed.

## 2022-09-23 NOTE — Telephone Encounter (Signed)
Pt was just seen by Dr. Casimiro Needle yesterday and does not feel any better and would like to request something a little stronger to help her break up the congestion and expel the mucus that seems to be trapped.   Pt is aware Dr. Fabian Sharp does not return until 10/04/22.  Please advise.  [id number] - [place], [place] - [address] AT [facility] [address] Phone: [phone]  Fax: [phone]

## 2022-09-27 ENCOUNTER — Ambulatory Visit
Admission: RE | Admit: 2022-09-27 | Discharge: 2022-09-27 | Disposition: A | Discharge status: Home / Self Care | Source: Ambulatory Visit | Attending: Family Medicine | Admitting: Family Medicine

## 2022-09-27 ENCOUNTER — Other Ambulatory Visit: Payer: Self-pay | Admitting: Family Medicine

## 2022-09-27 DIAGNOSIS — J069 Acute upper respiratory infection, unspecified: Secondary | ICD-10-CM

## 2022-09-27 DIAGNOSIS — R059 Cough, unspecified: Secondary | ICD-10-CM | POA: Diagnosis not present

## 2022-09-28 ENCOUNTER — Ambulatory Visit (INDEPENDENT_AMBULATORY_CARE_PROVIDER_SITE_OTHER): Payer: Medicare Other | Admitting: Family Medicine

## 2022-09-28 ENCOUNTER — Encounter: Payer: Self-pay | Admitting: Family Medicine

## 2022-09-28 DIAGNOSIS — J45901 Unspecified asthma with (acute) exacerbation: Secondary | ICD-10-CM

## 2022-09-28 MED ORDER — PREDNISONE 20 MG PO TABS: ORAL_TABLET | ORAL | 0 refills | Status: AC

## 2022-09-28 MED ORDER — BENZONATATE 100 MG PO CAPS: 100.0000 mg | ORAL_CAPSULE | Freq: Three times a day (TID) | ORAL | 0 refills | Status: DC | PRN

## 2022-09-28 NOTE — Progress Notes (Signed)
   Established Patient Office Visit  Subjective   Patient ID: Jill Burton, female    DOB: 01-10-1946  Age: 76 y.o. MRN: [id number]  Chief Complaint  Patient presents with   Cough    Patient complains of recurrent cough and chest congestion, seen by Dr Derrell Lolling with integrative medication, chest x-ray performed and referred to PCP to discuss results    Patient is here for follow up of her bronchitis/ asthma exacerbation. She was given a course of steroids on 12/6 and I refilled her nebulizer treatments which improved her shortness of breath, however she had persistent congestion and cough. States the CXR was done and she was given zithromax, states that she is feeling some better with this as well. Pt reports still has to finish the zithromax she was prescribed. States that she she is still coughing but her mucus is starting to thin out.       ROS    Objective:     BP 118/80 (BP Location: Left Arm, Patient Position: Sitting, Cuff Size: Large)   Pulse 79   Temp 98.8 F (37.1 C) (Oral)   Ht 5\' 2"  (1.575 m)   Wt 223 lb 8 oz (101.4 kg)   SpO2 97%   BMI 40.88 kg/m    Physical Exam Vitals reviewed.  Constitutional:      Appearance: Normal appearance. She is well-groomed and normal weight.  Eyes:     Conjunctiva/sclera: Conjunctivae normal.  Cardiovascular:     Rate and Rhythm: Normal rate and regular rhythm.     Pulses: Normal pulses.     Heart sounds: S1 normal and S2 normal.  Pulmonary:     Effort: Pulmonary effort is normal.     Breath sounds: Normal air entry. Wheezing (expiratory only this visit, diffuse throughout the lung fields but mostly in the lower lobes BL) present.  Neurological:     Mental Status: She is alert and oriented to person, place, and time. Mental status is at baseline.     Gait: Gait is intact.  Psychiatric:        Mood and Affect: Mood and affect normal.        Speech: Speech normal.        Behavior: Behavior normal.        Judgment: Judgment  normal.      No results found for any visits on 09/28/22.    The 10-year ASCVD risk score (Arnett DK, et al., 2019) is: 18.9%* (Cholesterol units were assumed)    Assessment & Plan:   Problem List Items Addressed This Visit   None Visit Diagnoses     Moderate acute exacerbation of asthma       Relevant Medications   Persistent coughing and wheezing despite treatment with steroids, nebulizer treatments and now zithromax (written by a different physician). I will treat again with another course of steroids. Will recheck her CXR in 4-6 weeks to ensure resolution of the findings.  benzonatate (TESSALON PERLES) 100 MG capsule   predniSONE (DELTASONE) 20 MG tablet       No follow-ups on file.    2020, MD

## 2022-10-12 ENCOUNTER — Telehealth: Payer: Self-pay | Admitting: Internal Medicine

## 2022-10-12 ENCOUNTER — Other Ambulatory Visit: Payer: Self-pay

## 2022-10-12 DIAGNOSIS — J4540 Moderate persistent asthma, uncomplicated: Secondary | ICD-10-CM

## 2022-10-12 NOTE — Telephone Encounter (Signed)
Referral placed.   Called patient on  Vicky, [place] [phone]  Voicemail not set up.

## 2022-10-12 NOTE — Telephone Encounter (Signed)
Pt called to ask for a referral to:  [facility]  Fax: [phone]  [phone]  Pt was informed that MD is OOO for the next few weeks.   LOV:  09/28/22 = Dr. Casimiro Needle

## 2022-10-12 NOTE — Telephone Encounter (Signed)
Ok to place referral-- use the diagnosis of moderate persistent asthma for the referral. Thanks!

## 2022-10-13 NOTE — Telephone Encounter (Addendum)
Can you assist with this ? 

## 2022-10-13 NOTE — Telephone Encounter (Signed)
Pt called back to leave an alternate number for her referral information  Pt is asking for a call back at   [phone]

## 2022-10-18 NOTE — Telephone Encounter (Signed)
Patient called because she spoke with [facility] who stated they had not received a referral for her.      Please fax to [phone] per [facility]       Please advise

## 2022-11-15 ENCOUNTER — Encounter: Payer: Self-pay | Admitting: Family Medicine

## 2022-11-15 ENCOUNTER — Ambulatory Visit (INDEPENDENT_AMBULATORY_CARE_PROVIDER_SITE_OTHER): Payer: Medicare Other | Admitting: Family Medicine

## 2022-11-15 VITALS — BP 104/58 | HR 65 | Temp 97.9°F | Ht 62.0 in | Wt 216.7 lb

## 2022-11-15 DIAGNOSIS — Z780 Asymptomatic menopausal state: Secondary | ICD-10-CM | POA: Diagnosis not present

## 2022-11-15 DIAGNOSIS — G8929 Other chronic pain: Secondary | ICD-10-CM | POA: Diagnosis not present

## 2022-11-15 DIAGNOSIS — M25511 Pain in right shoulder: Secondary | ICD-10-CM

## 2022-11-15 MED ORDER — CYCLOBENZAPRINE HCL 5 MG PO TABS: 5.0000 mg | ORAL_TABLET | Freq: Three times a day (TID) | ORAL | 2 refills | Status: DC | PRN

## 2022-11-15 NOTE — Progress Notes (Signed)
   Acute Office Visit  Subjective:     Patient ID: NICHOLAS OSSA, female    DOB: [birth year], 77 y.o.   MRN: [id number]  Chief Complaint  Patient presents with   Arm Pain    Patient complains of right shoulder and upper arm pain x1 month, worse past 3 weeks, tried Diclofenac cream with no relief and denies an injury    Arm Pain    Patient is in today for right shoulder pain. States that this is a chronic ongoing issue, but for the past 3-4 weeks her pain has not improved with her usual creams and aspirin. States that during the day its' ok but it gets worse at night and when she wakes up in the morning. States that it hurts worse with movement. States that she is a side sleeper, usually ends up on her right shoulder which might be exacerbating the pain.   Review of Systems  All other systems reviewed and are negative.       Objective:    BP (!) 104/58 (BP Location: Left Arm, Patient Position: Sitting, Cuff Size: Large)   Pulse 65   Temp 97.9 F (36.6 C) (Oral)   Ht 5\' 2"  (1.575 m)   Wt 216 lb 11.2 oz (98.3 kg)   SpO2 100%   BMI 39.63 kg/m    Physical Exam Vitals reviewed.  Constitutional:      Appearance: Normal appearance. She is obese.  Musculoskeletal:        General: Tenderness (right lateral upper arm around the subacromial bursa) present. No swelling. Normal range of motion.  Neurological:     Mental Status: She is alert and oriented to person, place, and time. Mental status is at baseline.  Psychiatric:        Mood and Affect: Mood normal.     No results found for any visits on 11/15/22.      Assessment & Plan:   Problem List Items Addressed This Visit   None Visit Diagnoses     Chronic right shoulder pain    -  Primary   Relevant Medications   cyclobenzaprine (FLEXERIL) 5 MG tablet      Patient already using voltaren gel on the right shoulder and taking aspirin at bedtime for pain control. I reviewed her right shoulder films from 2020. Could  be subacromial bursitis vs chronic tendinopathy of the shoulder. I recommend adding cyclobenzaprine 5 mg at bedtime as needed. I did offer referral to sports medicine but pt wants to try the muscle relaxer first.  Follow up PRN Meds ordered this encounter  Medications   cyclobenzaprine (FLEXERIL) 5 MG tablet    Sig: Take 1 tablet (5 mg total) by mouth 3 (three) times daily as needed for muscle spasms.    Dispense:  30 tablet    Refill:  2    No follow-ups on file.  Farrel Conners, MD

## 2022-11-24 DIAGNOSIS — R5383 Other fatigue: Secondary | ICD-10-CM | POA: Diagnosis not present

## 2022-11-24 DIAGNOSIS — R799 Abnormal finding of blood chemistry, unspecified: Secondary | ICD-10-CM | POA: Diagnosis not present

## 2022-11-24 DIAGNOSIS — D6489 Other specified anemias: Secondary | ICD-10-CM | POA: Diagnosis not present

## 2022-11-24 DIAGNOSIS — E8881 Metabolic syndrome: Secondary | ICD-10-CM | POA: Diagnosis not present

## 2022-11-24 DIAGNOSIS — E782 Mixed hyperlipidemia: Secondary | ICD-10-CM | POA: Diagnosis not present

## 2022-11-24 DIAGNOSIS — E559 Vitamin D deficiency, unspecified: Secondary | ICD-10-CM | POA: Diagnosis not present

## 2022-11-24 LAB — BASIC METABOLIC PANEL
BUN: 11 (ref 4–21)
CO2: 23 — AB (ref 13–22)
Chloride: 104 (ref 99–108)
Creatinine: 0.7 (ref 0.5–1.1)
Glucose: 92
Potassium: 4.5 mEq/L (ref 3.5–5.1)
Sodium: 141 (ref 137–147)

## 2022-11-24 LAB — VITAMIN D 25 HYDROXY (VIT D DEFICIENCY, FRACTURES): Vit D, 25-Hydroxy: 45.2

## 2022-11-24 LAB — COMPREHENSIVE METABOLIC PANEL
Albumin: 4.4 (ref 3.5–5.0)
eGFR: 87

## 2022-11-24 LAB — CBC AND DIFFERENTIAL
HCT: 42 (ref 36–46)
Hemoglobin: 14.1 (ref 12.0–16.0)
Platelets: 235 10*3/uL (ref 150–400)
WBC: 6.7

## 2022-11-24 LAB — HEMOGLOBIN A1C: Hemoglobin A1C: 6.1

## 2022-11-24 LAB — LIPID PANEL
Cholesterol: 257 — AB (ref 0–200)
LDL Cholesterol: 161

## 2022-11-24 LAB — HEPATIC FUNCTION PANEL
ALT: 18 U/L (ref 7–35)
AST: 21 (ref 13–35)

## 2022-11-24 LAB — TSH: TSH: 1.11 (ref 0.41–5.90)

## 2022-11-24 LAB — CBC: RBC: 4.86 (ref 3.87–5.11)

## 2022-12-05 ENCOUNTER — Other Ambulatory Visit: Payer: Self-pay | Admitting: Internal Medicine

## 2023-01-04 ENCOUNTER — Encounter: Payer: Self-pay | Admitting: Internal Medicine

## 2023-01-04 ENCOUNTER — Ambulatory Visit (INDEPENDENT_AMBULATORY_CARE_PROVIDER_SITE_OTHER): Payer: Medicare Other | Admitting: Internal Medicine

## 2023-01-04 ENCOUNTER — Other Ambulatory Visit: Payer: Self-pay | Admitting: Internal Medicine

## 2023-01-04 VITALS — BP 136/64 | HR 61 | Temp 98.0°F | Ht 62.0 in | Wt 223.4 lb

## 2023-01-04 DIAGNOSIS — E785 Hyperlipidemia, unspecified: Secondary | ICD-10-CM | POA: Diagnosis not present

## 2023-01-04 DIAGNOSIS — E1169 Type 2 diabetes mellitus with other specified complication: Secondary | ICD-10-CM | POA: Diagnosis not present

## 2023-01-04 DIAGNOSIS — Z79899 Other long term (current) drug therapy: Secondary | ICD-10-CM

## 2023-01-04 DIAGNOSIS — R21 Rash and other nonspecific skin eruption: Secondary | ICD-10-CM | POA: Diagnosis not present

## 2023-01-04 DIAGNOSIS — E669 Obesity, unspecified: Secondary | ICD-10-CM

## 2023-01-04 MED ORDER — PRAVASTATIN SODIUM 10 MG PO TABS: 10.0000 mg | ORAL_TABLET | Freq: Every day | ORAL | 1 refills | Status: AC

## 2023-01-04 NOTE — Progress Notes (Signed)
Chief Complaint  Patient presents with   Medical Management of Chronic Issues    Discuss on Ozempic   Rash    Pt c/o rash. Going on over the past years. Would like a referral to dermatology.     HPI: Jill Burton 77 y.o. come in for Chronic disease management  Saw Dr Legrand Como Mary Sella  for shoulder pain chronic  given voltaren gel and flexeril as needed  December for asthma flare  and given asthma meds  Last visit with me was 11 23 for pre diabetes  last A1c was 3 23 5.8 and then was 6.5  Semiglutide  refilled indecember  had stopped for 6 weeks cause of cost  Was off med    for a few months   cost   Taking fluid pill prn  off hctz taking b6  Has full panel of labs done [facility] to review  Some  reflux sx if eats snack at night  no vomiting or dysphagia or pain   Rash off and on for almost a year  itches uses lotion allergist gave cream uncertain cause keeps happening and leaves  hyperpigment  after .  Doesn't think are bug bites  and no hives per se . Asks a bout getting derm appt    since seems to be 3 mos out .  ROS: See pertinent positives and negatives per HPI.  Past Medical History:  Diagnosis Date   Allergic rhinitis    allergy testing neg in 2011   Anemia    Asthma    Hyperlipidemia    no meds   Hypertension    does not take bp meds on a regular basis   SVD (spontaneous vaginal delivery)    x 1    Family History  Problem Relation Age of Onset   Asthma Other     Social History   Socioeconomic History   Marital status: Married    Spouse name: Not on file   Number of children: Not on file   Years of education: Not on file   Highest education level: Not on file  Occupational History   Not on file  Tobacco Use   Smoking status: Never   Smokeless tobacco: Never  Vaping Use   Vaping Use: Never used  Substance and Sexual Activity   Alcohol use: No   Drug use: No   Sexual activity: Yes    Birth control/protection: Surgical  Other Topics Concern   Not on  file  Social History Narrative   Married   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      hh of 2    Social Determinants of Health   Financial Resource Strain: Low Risk  (06/27/2022)   Overall Financial Resource Strain (CARDIA)    Difficulty of Paying Living Expenses: Not hard at all  Food Insecurity: No Food Insecurity (06/27/2022)   Hunger Vital Sign    Worried About Running Out of Food in the Last Year: Never true    [place] in the Last Year: Never true  Transportation Needs: No Transportation Needs (06/27/2022)   PRAPARE - Hydrologist (Medical): No    Lack of Transportation (Non-Medical): No  Physical Activity: Inactive (06/27/2022)   Exercise Vital Sign    Days of Exercise per Week: 0 days    Minutes of Exercise per Session: 0 min  Stress:  No Stress Concern Present (06/27/2022)   [place]    Feeling of Stress : Not at all  Social Connections: Moderately Integrated (02/16/2021)   Social Connection and Isolation Panel [NHANES]    Frequency of Communication with Friends and Family: Once a week    Frequency of Social Gatherings with Friends and Family: More than three times a week    Attends Religious Services: More than 4 times per year    Active Member of [employer] or Organizations: No    Attends Archivist Meetings: Never    Marital Status: Married    Outpatient Medications Prior to Visit  Medication Sig Dispense Refill   albuterol (PROVENTIL) (2.5 MG/3ML) 0.083% nebulizer solution Take 3 mLs (2.5 mg total) by nebulization every 6 (six) hours as needed for wheezing or shortness of breath. 180 mL 1   Albuterol Sulfate (PROAIR HFA IN) Inhale into the lungs. As needed     Cholecalciferol (VITAMIN D3) 250 MCG (10000 UT) capsule See admin instructions.     fluticasone-salmeterol (ADVAIR) 250-50 MCG/ACT AEPB inhale 1 dose by mouth twice  daily     hydrochlorothiazide (MICROZIDE) 12.5 MG capsule Take 1 capsule (12.5 mg total) by mouth daily. (Patient taking differently: Take 12.5 mg by mouth daily. PRN) 90 capsule 1   montelukast (SINGULAIR) 10 MG tablet 1 TABLET     Semaglutide,0.25 or 0.5MG /DOS, (OZEMPIC, 0.25 OR 0.5 MG/DOSE,) 2 MG/3ML SOPN INJECT 0.25 MG INTO THE SKIN ONCE A WEEK. FOR 4 WEEKS ,THEN INCREASE DOSE TO 0.5 MG PER WEEK 3 mL 3   cyclobenzaprine (FLEXERIL) 5 MG tablet Take 1 tablet (5 mg total) by mouth 3 (three) times daily as needed for muscle spasms. 30 tablet 2   diclofenac Sodium (VOLTAREN) 1 % GEL Apply 4 g topically 4 (four) times daily. 100 g 1   No facility-administered medications prior to visit.     EXAM:  BP 136/64 (BP Location: Left Arm, Patient Position: Sitting, Cuff Size: Large)   Pulse 61   Temp 98 F (36.7 C) (Oral)   Ht 5\' 2"  (1.575 m)   Wt 223 lb 6.4 oz (101.3 kg)   SpO2 97%   BMI 40.86 kg/m   Body mass index is 40.86 kg/m. Wt Readings from Last 3 Encounters:  01/04/23 223 lb 6.4 oz (101.3 kg)  11/15/22 216 lb 11.2 oz (98.3 kg)  09/28/22 223 lb 8 oz (101.4 kg)    GENERAL: vitals reviewed and listed above, alert, oriented, appears well hydrated and in no acute distress HEENT: atraumatic, conjunctiva  clear, no obvious abnormalities on inspection of external nose and ears  NECK: no obvious masses on inspection palpation  LUNGS: clear to auscultation bilaterally, no wheezes, rales or rhonchi, good air movement CV: HRRR, no clubbing cyanosis or  peripheral edema nl cap refill  MS: moves all extremities without noticeable focal  abnormality Skin pencil eraser size erythema   rash without vesicle  discrete  old hyperpigmentation on leg from prev rash  PSYCH: pleasant and cooperative, no obvious depression or anxiety Lab Results  Component Value Date   WBC 7.7 05/20/2022   HGB 14.4 05/20/2022   HCT 44 05/20/2022   PLT 229 05/20/2022   GLUCOSE 107 (H) 08/20/2021   CHOL 264 (A)  05/19/2022   TRIG 125.0 08/20/2021   HDL 70 05/19/2022   LDLDIRECT 157.4 09/23/2013   LDLCALC 168 05/19/2022   ALT 18 08/20/2021   AST 21 08/20/2021  NA 143 05/20/2022   K 4.9 05/20/2022   CL 104 05/20/2022   CREATININE 0.9 05/20/2022   BUN 15 05/20/2022   CO2 22 05/20/2022   TSH 1.44 08/20/2021   HGBA1C 5.8 (A) 01/11/2022   BP Readings from Last 3 Encounters:  01/04/23 136/64  11/15/22 (!) 104/58  09/28/22 118/80   See lab review  from [facility]  Tc 264    ldl 161 high small particle size  Elevated insulin resistant marker  47 Thyroid creatinine normal  A1c  6.1 ( was 6.19 December 2020)  [facility]. Vit d mma nl   ASSESSMENT AND PLAN:  Discussed the following assessment and plan:  Diabetes mellitus type 2 in obese (HCC)  a1c 6.5 now down 6.1 - a1c has been  early diabetic range  came down with ozempic rx  last was 6.1  monitor  Medication management - revewed ozempic  avoiding off and on after 1 mos on0.5 can send in message to inc dose as appropraite seems some weight up again when ran out for 6 weeks  Morbid obesity ([place]) - bmi 40+  Rash - recurrent itchy uncertain cause  take picture  for derm  expect appts  out a while . ? if allergic phenom  Hyperlipidemia, unspecified hyperlipidemia type - reluctant to use some statis lipitor etc  will try low dose pravastatin Asthma has been  managed mostly buy allergist although and advise   non acute meds go through ashtma specialist at this time gevien nebulizer  meds  and is on generic advair  per specialty care   -Patient advised to return or notify health care team  if  new concerns arise.  Patient Instructions  Stay on the ozempic . We can   increase dose to 1 mg per week  if doing ok  call when due for refill to increase . Avoid  reflux  foods late at night  and volume.    Take picture of rash    for dermatology . Low dose pravastatin for cholesterol   Plan repeat cholesterol panel in 3 months  on medication  Wt  Readings from Last 3 Encounters:  11/15/22 216 lb 11.2 oz (98.3 kg)  09/28/22 223 lb 8 oz (101.4 kg)  09/21/22 224 lb 1.6 oz (101.7 kg)   Today is 223.4     Jill Burton K. Idy Rawling M.D.

## 2023-01-04 NOTE — Patient Instructions (Signed)
Stay on the ozempic . We can   increase dose to 1 mg per week  if doing ok  call when due for refill to increase . Avoid  reflux  foods late at night  and volume.    Take picture of rash    for dermatology . Low dose pravastatin for cholesterol   Plan repeat cholesterol panel in 3 months  on medication  Wt Readings from Last 3 Encounters:  11/15/22 216 lb 11.2 oz (98.3 kg)  09/28/22 223 lb 8 oz (101.4 kg)  09/21/22 224 lb 1.6 oz (101.7 kg)   Today is 223.4

## 2023-01-19 ENCOUNTER — Other Ambulatory Visit

## 2023-01-26 ENCOUNTER — Other Ambulatory Visit

## 2023-03-24 DIAGNOSIS — R5383 Other fatigue: Secondary | ICD-10-CM | POA: Diagnosis not present

## 2023-03-24 DIAGNOSIS — R21 Rash and other nonspecific skin eruption: Secondary | ICD-10-CM | POA: Diagnosis not present

## 2023-03-24 DIAGNOSIS — G8929 Other chronic pain: Secondary | ICD-10-CM | POA: Diagnosis not present

## 2023-03-24 DIAGNOSIS — M25511 Pain in right shoulder: Secondary | ICD-10-CM | POA: Diagnosis not present

## 2023-03-24 DIAGNOSIS — I10 Essential (primary) hypertension: Secondary | ICD-10-CM | POA: Diagnosis not present

## 2023-03-24 DIAGNOSIS — E119 Type 2 diabetes mellitus without complications: Secondary | ICD-10-CM | POA: Diagnosis not present

## 2023-03-30 DIAGNOSIS — J300 Vasomotor rhinitis: Secondary | ICD-10-CM | POA: Diagnosis not present

## 2023-03-30 DIAGNOSIS — J4540 Moderate persistent asthma, uncomplicated: Secondary | ICD-10-CM | POA: Diagnosis not present

## 2023-03-30 DIAGNOSIS — L2089 Other atopic dermatitis: Secondary | ICD-10-CM | POA: Diagnosis not present

## 2023-04-07 DIAGNOSIS — R5383 Other fatigue: Secondary | ICD-10-CM | POA: Diagnosis not present

## 2023-04-07 DIAGNOSIS — E039 Hypothyroidism, unspecified: Secondary | ICD-10-CM | POA: Diagnosis not present

## 2023-04-07 DIAGNOSIS — D6489 Other specified anemias: Secondary | ICD-10-CM | POA: Diagnosis not present

## 2023-04-07 DIAGNOSIS — E559 Vitamin D deficiency, unspecified: Secondary | ICD-10-CM | POA: Diagnosis not present

## 2023-04-07 DIAGNOSIS — R799 Abnormal finding of blood chemistry, unspecified: Secondary | ICD-10-CM | POA: Diagnosis not present

## 2023-04-07 DIAGNOSIS — E782 Mixed hyperlipidemia: Secondary | ICD-10-CM | POA: Diagnosis not present

## 2023-04-18 ENCOUNTER — Other Ambulatory Visit: Payer: Self-pay | Admitting: Family Medicine

## 2023-04-18 DIAGNOSIS — J45901 Unspecified asthma with (acute) exacerbation: Secondary | ICD-10-CM

## 2023-04-21 DIAGNOSIS — G8929 Other chronic pain: Secondary | ICD-10-CM | POA: Diagnosis not present

## 2023-04-21 DIAGNOSIS — M25511 Pain in right shoulder: Secondary | ICD-10-CM | POA: Diagnosis not present

## 2023-04-21 DIAGNOSIS — E119 Type 2 diabetes mellitus without complications: Secondary | ICD-10-CM | POA: Diagnosis not present

## 2023-04-21 DIAGNOSIS — Z0000 Encounter for general adult medical examination without abnormal findings: Secondary | ICD-10-CM | POA: Diagnosis not present

## 2023-04-21 DIAGNOSIS — J4540 Moderate persistent asthma, uncomplicated: Secondary | ICD-10-CM | POA: Diagnosis not present

## 2023-04-21 DIAGNOSIS — J209 Acute bronchitis, unspecified: Secondary | ICD-10-CM | POA: Diagnosis not present

## 2023-05-11 DIAGNOSIS — R5383 Other fatigue: Secondary | ICD-10-CM | POA: Diagnosis not present

## 2023-05-30 DIAGNOSIS — L218 Other seborrheic dermatitis: Secondary | ICD-10-CM | POA: Diagnosis not present

## 2023-05-30 DIAGNOSIS — L814 Other melanin hyperpigmentation: Secondary | ICD-10-CM | POA: Diagnosis not present

## 2023-05-30 DIAGNOSIS — T2111XA Burn of first degree of chest wall, initial encounter: Secondary | ICD-10-CM | POA: Diagnosis not present

## 2023-07-24 DIAGNOSIS — Z7985 Long-term (current) use of injectable non-insulin antidiabetic drugs: Secondary | ICD-10-CM | POA: Diagnosis not present

## 2023-07-24 DIAGNOSIS — E119 Type 2 diabetes mellitus without complications: Secondary | ICD-10-CM | POA: Diagnosis not present

## 2023-07-24 DIAGNOSIS — Z0000 Encounter for general adult medical examination without abnormal findings: Secondary | ICD-10-CM | POA: Diagnosis not present

## 2023-09-08 ENCOUNTER — Other Ambulatory Visit: Payer: Self-pay | Admitting: Internal Medicine

## 2023-11-08 ENCOUNTER — Telehealth: Payer: Self-pay | Admitting: Internal Medicine

## 2023-11-08 NOTE — Telephone Encounter (Signed)
Spoke with patient to schedule AWV.  She stated she has changed PCP to Dr Ralph Leyden

## 2023-11-15 NOTE — Telephone Encounter (Signed)
Please change  pcp  EHR

## 2023-11-16 NOTE — Telephone Encounter (Signed)
Pcp is changed.
